# Patient Record
Sex: Female | Born: 1985 | Race: Black or African American | Hispanic: No | Marital: Married | Smoking: Never smoker
Health system: Southern US, Community
[De-identification: ages and names within clinical notes are randomized; demographics above are authoritative.]

## PROBLEM LIST (undated history)

## (undated) DIAGNOSIS — E559 Vitamin D deficiency, unspecified: Secondary | ICD-10-CM

## (undated) DIAGNOSIS — E282 Polycystic ovarian syndrome: Secondary | ICD-10-CM

## (undated) DIAGNOSIS — K219 Gastro-esophageal reflux disease without esophagitis: Secondary | ICD-10-CM

## (undated) DIAGNOSIS — R000 Tachycardia, unspecified: Secondary | ICD-10-CM

## (undated) DIAGNOSIS — O24419 Gestational diabetes mellitus in pregnancy, unspecified control: Secondary | ICD-10-CM

## (undated) DIAGNOSIS — Z789 Other specified health status: Secondary | ICD-10-CM

## (undated) DIAGNOSIS — F32A Depression, unspecified: Secondary | ICD-10-CM

## (undated) DIAGNOSIS — F329 Major depressive disorder, single episode, unspecified: Secondary | ICD-10-CM

## (undated) HISTORY — DX: Depression, unspecified: F32.A

## (undated) HISTORY — DX: Gastro-esophageal reflux disease without esophagitis: K21.9

## (undated) HISTORY — DX: Polycystic ovarian syndrome: E28.2

## (undated) HISTORY — DX: Vitamin D deficiency, unspecified: E55.9

## (undated) HISTORY — DX: Gestational diabetes mellitus in pregnancy, unspecified control: O24.419

## (undated) HISTORY — DX: Tachycardia, unspecified: R00.0

## (undated) HISTORY — PX: WISDOM TOOTH EXTRACTION: SHX21

## (undated) HISTORY — PX: NO PAST SURGERIES: SHX2092

---
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
## 2013-02-05 ENCOUNTER — Encounter (HOSPITAL_COMMUNITY): Payer: Self-pay | Admitting: Emergency Medicine

## 2013-02-05 ENCOUNTER — Emergency Department (HOSPITAL_COMMUNITY)
Admission: EM | Admit: 2013-02-05 | Discharge: 2013-02-05 | Disposition: A | Discharge status: Home / Self Care | Payer: Self-pay | Attending: Emergency Medicine | Admitting: Emergency Medicine

## 2013-02-05 ENCOUNTER — Emergency Department (HOSPITAL_COMMUNITY): Payer: Self-pay

## 2013-02-05 DIAGNOSIS — Z3202 Encounter for pregnancy test, result negative: Secondary | ICD-10-CM | POA: Insufficient documentation

## 2013-02-05 DIAGNOSIS — R1031 Right lower quadrant pain: Secondary | ICD-10-CM | POA: Insufficient documentation

## 2013-02-05 DIAGNOSIS — N83209 Unspecified ovarian cyst, unspecified side: Secondary | ICD-10-CM

## 2013-02-05 DIAGNOSIS — R109 Unspecified abdominal pain: Secondary | ICD-10-CM

## 2013-02-05 DIAGNOSIS — R000 Tachycardia, unspecified: Secondary | ICD-10-CM | POA: Insufficient documentation

## 2013-02-05 LAB — URINALYSIS, ROUTINE W REFLEX MICROSCOPIC
Bilirubin Urine: NEGATIVE
Leukocytes, UA: NEGATIVE
Nitrite: NEGATIVE
Specific Gravity, Urine: 1.025 (ref 1.005–1.030)
Urobilinogen, UA: 0.2 mg/dL (ref 0.0–1.0)
pH: 5.5 (ref 5.0–8.0)

## 2013-02-05 LAB — CBC
HCT: 43.7 % (ref 36.0–46.0)
Hemoglobin: 14.9 g/dL (ref 12.0–15.0)
MCH: 28.9 pg (ref 26.0–34.0)
MCHC: 34.1 g/dL (ref 30.0–36.0)
RDW: 12.8 % (ref 11.5–15.5)

## 2013-02-05 LAB — COMPREHENSIVE METABOLIC PANEL
Albumin: 4 g/dL (ref 3.5–5.2)
BUN: 12 mg/dL (ref 6–23)
Calcium: 9.6 mg/dL (ref 8.4–10.5)
Creatinine, Ser: 0.78 mg/dL (ref 0.50–1.10)
GFR calc Af Amer: >90 mL/min (ref >90)
Glucose, Bld: 84 mg/dL (ref 70–99)
Potassium: 3.8 mEq/L (ref 3.5–5.1)
Total Protein: 8.1 g/dL (ref 6.0–8.3)

## 2013-02-05 LAB — PREGNANCY, URINE: Preg Test, Ur: NEGATIVE

## 2013-02-05 MED ORDER — DEXTROSE 5 % IV SOLN
250.0000 mg | Freq: Once | INTRAVENOUS | Status: AC
  Administered 2013-02-05: 250 mg via INTRAVENOUS
  Filled 2013-02-05: qty 250

## 2013-02-05 MED ORDER — HYDROCODONE-ACETAMINOPHEN 5-325 MG PO TABS: 2.0000 | ORAL_TABLET | ORAL | Status: DC | PRN

## 2013-02-05 MED ORDER — IOHEXOL 300 MG/ML  SOLN
50.0000 mL | INTRAMUSCULAR | Status: AC
  Administered 2013-02-05: 50 mL via ORAL

## 2013-02-05 MED ORDER — ONDANSETRON HCL 4 MG/2ML IJ SOLN
4.0000 mg | Freq: Once | INTRAMUSCULAR | Status: AC
  Administered 2013-02-05: 4 mg via INTRAVENOUS
  Filled 2013-02-05: qty 2

## 2013-02-05 MED ORDER — MORPHINE SULFATE 4 MG/ML IJ SOLN
4.0000 mg | Freq: Once | INTRAMUSCULAR | Status: AC
  Administered 2013-02-05: 4 mg via INTRAVENOUS
  Filled 2013-02-05: qty 1

## 2013-02-05 MED ORDER — IOHEXOL 300 MG/ML  SOLN
100.0000 mL | Freq: Once | INTRAMUSCULAR | Status: AC | PRN
  Administered 2013-02-05: 100 mL via INTRAVENOUS

## 2013-02-05 MED ORDER — AZITHROMYCIN 1 G PO PACK
1.0000 g | PACK | Freq: Once | ORAL | Status: AC
  Administered 2013-02-05: 1 g via ORAL
  Filled 2013-02-05: qty 1

## 2013-02-05 MED ORDER — SODIUM CHLORIDE 0.9 % IV BOLUS (SEPSIS)
1000.0000 mL | Freq: Once | INTRAVENOUS | Status: AC
  Administered 2013-02-05: 1000 mL via INTRAVENOUS

## 2013-02-05 NOTE — ED Notes (Signed)
Patient currently sitting up in bed; no respiratory or acute distress noted.  Patient updated on plan of care; informed patient that she is currently pending for CT scan.  Patient denies any needs at this time; will continue to monitor.

## 2013-02-05 NOTE — ED Notes (Signed)
Patient back from CT scan; currently sitting up in bed; no respiratory or acute distress noted.  Patient updated on plan of care; informed patient that we are currently waiting for results of CT scan.  Patient denies any other needs at this time; will continue to monitor.

## 2013-02-05 NOTE — ED Notes (Signed)
Patient currently resting quietly in bed; no respiratory or acute distress noted.  Patient updated on plan of care; informed patient that we are currently going to administer Rocephin through her IV.  After administration, patient will be discharged home.  Patient denies any other symptoms at this time.  Will continue to monitor.

## 2013-02-05 NOTE — ED Notes (Signed)
reprots sudden onset of lower abdominal pain that has worsened over the course of the day. LMP 1/27, denies vaginal discharge and odor. Pain is intermittent. Denies nausea, vomiting, diarrhhea.

## 2013-02-05 NOTE — ED Notes (Signed)
Received bedside report from Sophia Oneill, [place].  Patient currently resting quietly in bed; no respiratory or acute distress noted.  Patient given contrast/water cups to drink.  Informed patient to notify staff when she has finished the first cup.  Patient denies any needs at this time; will continue to monitor.

## 2013-02-05 NOTE — ED Provider Notes (Signed)
History     CSN: [id number]  Arrival date & time 02/05/13  0004   First MD Initiated Contact with Patient 02/05/13 0012      Chief Complaint  Patient presents with  . Abdominal Pain    (Consider location/radiation/quality/duration/timing/severity/associated sxs/prior treatment) HPI  History reviewed. No pertinent past medical history.  History reviewed. No pertinent past surgical history.  History reviewed. No pertinent family history.  History  Substance Use Topics  . Smoking status: Never Smoker   . Smokeless tobacco: Not on file  . Alcohol Use: No    OB History   Grav Para Term Preterm Abortions TAB SAB Ect Mult Living                  Review of Systems  Allergies  Review of patient's allergies indicates no known allergies.  Home Medications   Current Outpatient Rx  Name  Route  Sig  Dispense  Refill  . Multiple Vitamin (MULTIVITAMIN WITH MINERALS) TABS   Oral   Take 1 tablet by mouth daily.         Marland Kitchen HYDROcodone-acetaminophen (NORCO/VICODIN) 5-325 MG per tablet   Oral   Take 2 tablets by mouth every 4 (four) hours as needed for pain.   10 tablet   0     BP 137/99  Pulse 108  Temp(Src) 98.6 F (37 C) (Oral)  Resp 20  SpO2 100%  LMP 01/20/2013  Physical Exam  ED Course  Procedures (including critical care time)  Labs Reviewed  CBC - Abnormal; Notable for the following:    WBC 15.5 (*)    RBC 5.16 (*)    All other components within normal limits  URINALYSIS, ROUTINE W REFLEX MICROSCOPIC - Abnormal; Notable for the following:    APPearance CLOUDY (*)    All other components within normal limits  COMPREHENSIVE METABOLIC PANEL  PREGNANCY, URINE   Ct Abdomen Pelvis W Contrast  02/05/2013  *RADIOLOGY REPORT*  Clinical Data: Sudden onset of lower abdominal pain.  CT ABDOMEN AND PELVIS WITH CONTRAST  Technique:  Multidetector CT imaging of the abdomen and pelvis was performed following the standard protocol during bolus administration of  intravenous contrast.  Contrast: OMNIPAQUE IOHEXOL 300 MG/ML  SOLN  Comparison: None.  Findings: The visualized lung bases are clear.  The liver and spleen are unremarkable in appearance.  The gallbladder is within normal limits.  The pancreas and adrenal glands are unremarkable.  A 3 mm stone is noted at the interpole region of the right kidney. The kidneys are otherwise unremarkable in appearance.  There is no evidence of hydronephrosis.  No obstructing ureteral stones are seen.  No perinephric stranding is appreciated.  No free fluid is identified.  The small bowel is unremarkable in appearance.  The stomach is within normal limits.  No acute vascular abnormalities are seen.  The appendix is normal in caliber, without evidence for appendicitis.  The colon is unremarkable in appearance.  The bladder is decompressed and not well assessed.  The uterus is grossly unremarkable in appearance.  The ovaries are grossly symmetric; no suspicious adnexal masses are seen.  A 2.7 cm right adnexal cystic lesion is likely physiologic in nature.  No inguinal lymphadenopathy is seen.  No acute osseous abnormalities are identified.  IMPRESSION:  1.  No acute abnormality seen within the abdomen or pelvis. 2.  3 mm nonobstructing stone noted at the interpole region of the right kidney.   Original Report Authenticated By: Tonia Ghent,  M.D.      1. Abdominal pain   2. Ovarian cyst       MDM  Improved.  Ct + adnexal cyst.  Will abx to prophylax.  Will dc with analgesia to fu oupt.  Ret new/worsening sxs        Nickey Kloepfer Lytle Michaels, MD 02/05/13 [phone]

## 2013-02-05 NOTE — ED Provider Notes (Signed)
History     CSN: [id number]  Arrival date & time 02/05/13  0004   First MD Initiated Contact with Patient 02/05/13 0012      Chief Complaint  Patient presents with  . Abdominal Pain    (Consider location/radiation/quality/duration/timing/severity/associated sxs/prior treatment) Patient is a 27 y.o. female presenting with abdominal pain. The history is provided by the patient.  Abdominal Pain Pain location:  RLQ Pain quality: aching and cramping   Pain radiates to:  Does not radiate Pain severity:  Mild Onset quality:  Sudden Duration:  6 hours Timing:  Intermittent Progression:  Waxing and waning Chronicity:  New Relieved by:  Nothing Worsened by:  Nothing tried Ineffective treatments:  None tried   No past medical history on file.  No past surgical history on file.  No family history on file.  History  Substance Use Topics  . Smoking status: Not on file  . Smokeless tobacco: Not on file  . Alcohol Use: Not on file    OB History   No data available      Review of Systems  Gastrointestinal: Positive for abdominal pain.  All other systems reviewed and are negative.    Allergies  Review of patient's allergies indicates no known allergies.  Home Medications  No current outpatient prescriptions on file.  BP 176/98  Pulse 125  Temp(Src) 98.6 F (37 C) (Oral)  Resp 18  SpO2 100%  LMP 01/20/2013  Physical Exam  Constitutional: She is oriented to person, place, and time. She appears well-developed and well-nourished.  HENT:  Head: Normocephalic and atraumatic.  Eyes: Conjunctivae and EOM are normal. Pupils are equal, round, and reactive to light.  Neck: Normal range of motion.  Cardiovascular: Regular rhythm and normal heart sounds.  Tachycardia present.   Pulmonary/Chest: Effort normal and breath sounds normal.  Abdominal: Soft. Bowel sounds are normal. There is tenderness in the suprapubic area.    Musculoskeletal: Normal range of motion.    Neurological: She is alert and oriented to person, place, and time.  Skin: Skin is warm and dry.  Psychiatric: She has a normal mood and affect. Her behavior is normal.    ED Course  Procedures (including critical care time)  Labs Reviewed  CBC  COMPREHENSIVE METABOLIC PANEL  URINALYSIS, ROUTINE W REFLEX MICROSCOPIC  PREGNANCY, URINE  URINALYSIS, ROUTINE W REFLEX MICROSCOPIC   No results found.   No diagnosis found.    MDM  + intermitant rlq pain.  Tachycardia.  Will ivf, analgesia,  Lab, reassess        Rosanne Ashing, MD 02/05/13 0028

## 2013-02-22 DIAGNOSIS — E282 Polycystic ovarian syndrome: Secondary | ICD-10-CM

## 2013-02-22 HISTORY — DX: Polycystic ovarian syndrome: E28.2

## 2013-05-26 ENCOUNTER — Other Ambulatory Visit (HOSPITAL_COMMUNITY): Payer: Self-pay | Admitting: Obstetrics & Gynecology

## 2013-05-26 DIAGNOSIS — N970 Female infertility associated with anovulation: Secondary | ICD-10-CM

## 2013-05-26 DIAGNOSIS — N943 Premenstrual tension syndrome: Secondary | ICD-10-CM

## 2013-06-02 ENCOUNTER — Ambulatory Visit (HOSPITAL_COMMUNITY): Payer: Self-pay

## 2013-11-04 ENCOUNTER — Encounter: Payer: Self-pay | Admitting: Internal Medicine

## 2013-11-04 ENCOUNTER — Ambulatory Visit (INDEPENDENT_AMBULATORY_CARE_PROVIDER_SITE_OTHER): Payer: BC Managed Care – PPO | Admitting: Internal Medicine

## 2013-11-04 VITALS — BP 120/84 | HR 105 | Temp 98.5°F | Resp 12 | Ht 63.0 in | Wt 200.7 lb

## 2013-11-04 DIAGNOSIS — E282 Polycystic ovarian syndrome: Secondary | ICD-10-CM

## 2013-11-04 NOTE — Progress Notes (Signed)
Patient ID: Sophia Oneill, female   DOB: 1986/12/04, 27 y.o.   MRN: [id number]  HPI: Sophia Oneill is a 27 y.o. female, referred by Dr Mitchel Honour for evaluation for PCOS.  She was dx with PCOS in 02/2013 >> weight gain and irregular menstrual cycles.She had TV U/S and was told she had ovarian cysts. She was started on OCP Sprintec >> stopped as she did not notice weight loss. She did not want to take them in the first place as she was trying to conceive >> stopped in 05/2013.  Weight gain: - 10 lbs in 2 months - no steroid use - no weight loss meds - Meals: - Breakfast: yoghurt, fruit - Lunch: fish/baked chicken/veggies - Dinner: salad/chicken/turkey burger - Snacks: 3: granola bars/fruit - Diets tried: low-carb diet - she meets with a nutritionist every month - Exercise: cardio, 1-3x a week  Fertility/Menstrual cycles: - + h/o ovarian cyst on right ovary - went to ED for AP in 01/2013 - children: 0 - miscarriages: 1 in 2008, at that time she had regular menstrual cycles - contraception:no, tried to conceive for >1 year - ovulation strips positive - LMP 10/21/2013  Acne: - has acne on face >> saw dermatologist >> started adapalene gel and doxycyclene  Hirsutism: - chin, not much  Treatments tried: - did not try Metformin - did not try Spironolactone - did not try [place]  Other meds: - SSRIs: no  Other medical pbs: - GERD  - Last thyroid tests, from 2 years ago: normal, per her report, no records available  - she checks sugars every am: 60-70s  Pt has FH of obesity in sister. FH of DM in father. Sister has PCOS.  ROS: Constitutional: + weight gain, + increased appetiteno fatigue, no subjective hyperthermia/hypothermia, + increased urination Eyes: no blurry vision, no xerophthalmia ENT: no sore throat, no nodules palpated in throat, no dysphagia/odynophagia, no hoarseness Cardiovascular: no CP/SOB/palpitations/leg swelling Respiratory: no  cough/SOB Gastrointestinal: + N/no V/D/C/+ heartburn Musculoskeletal: no muscle/joint aches Skin: + acne spots on face - healed, hair on chin Neurological: no tremors/numbness/tingling/dizziness, + HA Psychiatric: no depression/anxiety  Past Medical History  Diagnosis Date  . PCOS (polycystic ovarian syndrome) 02/2013   History reviewed. No pertinent past surgical history. History   Social History  . Marital Status: Single    Spouse Name: N/A    Number of Children: 0        Occupational History  . Client service associate   Social History Main Topics  . Smoking status: Never Smoker   . Smokeless tobacco: Not on file  . Alcohol Use: No  . Drug Use: No  . Sexual Activity: Yes    Partners: Male   Social History Narrative   Regular exercise: yes, cycling, treadmill, weights   Caffeine use: none   Current Outpatient Prescriptions on File Prior to Visit  Medication Sig Dispense Refill  . Multiple Vitamin (MULTIVITAMIN WITH MINERALS) TABS Take 1 tablet by mouth daily.      Marland Kitchen HYDROcodone-acetaminophen (NORCO/VICODIN) 5-325 MG per tablet Take 2 tablets by mouth every 4 (four) hours as needed for pain.  10 tablet  0   No current facility-administered medications on file prior to visit.   No Known Allergies Family History  Problem Relation Age of Onset  . Hypertension Mother   . Diabetes Father     DM Type II  . Hypertension Father    PE: BP 120/84  Pulse 105  Temp(Src) 98.5 F (36.9 C) (Oral)  Resp 12  Ht 5\' 3"  (1.6 m)  Wt 200 lb 11.2 oz (91.037 kg)  BMI 35.56 kg/m2  SpO2 96% Wt Readings from Last 3 Encounters:  11/04/13 200 lb 11.2 oz (91.037 kg)   Constitutional: overweight, in NAD, no full supraclavicular fat pads Eyes: PERRLA, EOMI, no exophthalmos ENT: moist mucous membranes, no thyromegaly, no cervical lymphadenopathy Cardiovascular: RRR, No MRG Respiratory: CTA B Gastrointestinal: abdomen soft, NT, ND, BS+ Musculoskeletal: no deformities, strength  intact in all 4 Skin: moist, warm; no hair visible on chin (plucked), + acne spots - healed, on chin, no vellum on sideburns, no skin tags, + acanthosis nigricans, no purple, wide, stretch marks Neurological: no tremor with outstretched hands, DTR normal in all 4  ASSESSMENT: 1. ?PCOS  PLAN: 1.  I had a long discussion with the patient about the fact that the PCOS is a misnomer, a patient does not actually have to have polycystic ovaries to be diagnosed with the disorder. This is of sum of several conditions, including insulin resistance (and therefore a higher risk of developing diabetes later in life), weight gain, acne, hirsutism, irregular menstrual cycles, and decreased fertility. - We also discussed about the fact that the treatment is usually targeted to addressing the problem that concerns the patient the most: acne/hirsutism, weight gain, or fertility, but there is no single treatment for PCOS. The first-line therapy are oral contraceptives which she is avoiding because of desired pregnancy. Since she is concerned with her weight, we can use metformin; since she is concerned about fertility, we can try metformin and if this does not work, I will refer her to reproductive endocrinology for possible use of clomiphene. - I would first want to make sure that she has PCOS, and that her thyroid and prolactin tests are normal. I will add a hemoglobin A1c I will also check a pregnancy test. To rule out [place]-CAH, I will check a 17 hydroxyprogesterone. She will return in the third day of her menstrual cycle to have these drawn (she is now at ovulation, which can influence the tests). Orders Placed This Encounter  Procedures  . TSH  . T4, free  . T3, free  . Prolactin  . Testosterone, free, total  . LH  . FSH  . 17-Hydroxyprogesterone  . HgB A1c  . Androstenedione  . hCG, quantitative, pregnancy  . Estradiol  - I will see her back in 4 months, but will start metformin as soon as I have the labs  back  TSH      Result Value Range   TSH 0.98  0.35 - 5.50 uIU/mL  T4, FREE      Result Value Range   Free T4 0.77  0.60 - 1.60 ng/dL  T3, FREE      Result Value Range   T3, Free 3.7  2.3 - 4.2 pg/mL  LUTEINIZING HORMONE      Result Value Range   LH 17.64    FOLLICLE STIMULATING HORMONE      Result Value Range   FSH 6.9    HEMOGLOBIN A1C      Result Value Range   Hemoglobin A1C 5.8  4.6 - 6.5 %  HCG, QUANTITATIVE, PREGNANCY      Result Value Range   hCG, Beta Chain, Quant, S 0.43    TESTOSTERONE, FREE, TOTAL      Result Value Range   Testosterone 71 (*) 10 - 70 ng/dL   Sex Hormone Binding 18  18 - 114 nmol/L  Testosterone, Free 17.6 (*) 0.6 - 6.8 pg/mL   Testosterone-% Free 2.5 (*) 0.4 - 2.4 %   Narrative:    Performed at:  [facility]                [address]                [place], [place] [address]  PROLACTIN      Result Value Range   Prolactin 7.0     Narrative:    Performed at:  [facility]                [address]                [place], [place] [address]  ANDROSTENEDIONE      Result Value Range   Androstenedione 263     Narrative:    Performed at:  [facility]                [phone] [facility]                [place], [place] [address]  17-HYDROXYPROGESTERONE      Result Value Range   17-OH-Progesterone, LC/MS/MS 83     Narrative:    Performed at:  [facility]                [phone] [facility] [place], [place] [address]  ESTRADIOL, FREE      Result Value Range   Estradiol, Free 0.95     Estradiol 46     Results Received 11/27/13     Narrative:    Performed at:  [facility]                [address]                [place], [place] [address]   3:1 ratio LH: FSH, common in PCOS. TFTs normal. HbA1c a little high, in the preDM range, but test not very specific at the low levels. Testosterone high, as is Androstenedione - all of the above  indicative of PCOS.   Received records from OB/GYN (Dr. Mitchel Honour) on 11/27/2013: - 03/13/2013: Patient had transvaginal ultrasound:  Uterus with volume 60 cc, length 6.7x AP 3.7 x width 4.6 cm Endometrial thickness 5.6 mm Multiple intramural fibroids, largest measuring 1.4 cm. One of them does abut the endometrial  stripe in the left fundal area. Ovaries have multiple follicular cysts, largest on each side measuring 7 mm. These  suggest polycystic ovaries. No fluid in the cul-de-sac. - at that time, it was also mentioned that she was evaluated on 02/05/2013 in the emergency room for right lower quadrant pain, and was found to have a 2.7 cm right ovarian simple cyst. She was reportedly treated with antibiotics and the pain resolved, but has gradually returned. - At that time, the question that patient was on Sprintec - Pap smear from 03/05/2013 normal  From all the above investigations, pt with clear picture of PCOS. Will start Metformin - advance to 1000 mg bid.

## 2013-11-04 NOTE — Patient Instructions (Addendum)
Please return in the 3rd day of your menstrual cycle for labs. Please come back for a follow-up appointment in 4 months.

## 2013-11-05 ENCOUNTER — Other Ambulatory Visit: Payer: BC Managed Care – PPO

## 2013-11-10 ENCOUNTER — Telehealth: Payer: Self-pay | Admitting: Internal Medicine

## 2013-11-10 NOTE — Telephone Encounter (Signed)
Received 11 pages from [facility], sent to Dr. Elvera Lennox @ [facility]. 11/10/13/ss

## 2013-11-18 ENCOUNTER — Other Ambulatory Visit (INDEPENDENT_AMBULATORY_CARE_PROVIDER_SITE_OTHER): Payer: BC Managed Care – PPO

## 2013-11-18 DIAGNOSIS — E282 Polycystic ovarian syndrome: Secondary | ICD-10-CM

## 2013-11-19 LAB — T4, FREE: Free T4: 0.77 ng/dL (ref 0.60–1.60)

## 2013-11-19 LAB — LUTEINIZING HORMONE: LH: 17.64 m[IU]/mL

## 2013-11-19 LAB — FOLLICLE STIMULATING HORMONE: FSH: 6.9 m[IU]/mL

## 2013-11-19 LAB — T3, FREE: T3, Free: 3.7 pg/mL (ref 2.3–4.2)

## 2013-11-21 LAB — TESTOSTERONE, FREE, TOTAL, SHBG: Sex Hormone Binding: 18 nmol/L (ref 18–114)

## 2013-11-24 LAB — ANDROSTENEDIONE: Androstenedione: 263 ng/dL

## 2013-11-24 LAB — 17-HYDROXYPROGESTERONE: 17-OH-Progesterone, LC/MS/MS: 83 ng/dL

## 2013-11-27 LAB — ESTRADIOL, FREE

## 2013-11-28 ENCOUNTER — Encounter: Payer: Self-pay | Admitting: Internal Medicine

## 2013-11-28 MED ORDER — METFORMIN HCL 1000 MG PO TABS: 1000.0000 mg | ORAL_TABLET | Freq: Two times a day (BID) | ORAL | Status: DC

## 2013-12-01 ENCOUNTER — Telehealth: Payer: Self-pay

## 2013-12-01 NOTE — Telephone Encounter (Signed)
Patient called requesting form for work.   Thanks!

## 2013-12-05 ENCOUNTER — Telehealth: Payer: Self-pay | Admitting: *Deleted

## 2013-12-05 NOTE — Telephone Encounter (Signed)
Hi Dr. Elvera Lennox! I sent over forms to your office from my employer(FMLA forms) because of the side effects of the Metformin which is causing issues with my job performance.I just wanted to confirm if they were received. I did fax them to [phone]. Thank you! Sophia Oneill

## 2013-12-25 NOTE — L&D Delivery Note (Signed)
Delivery Note At 11:34 PM a healthy female was delivered via Vaginal, Spontaneous Delivery (Presentation: Right Occiput Anterior).  APGAR: 8, 9; weight pending.   Placenta status: Intact, Spontaneous.  Cord: 3 vessels with the following complications: None.  Cord pH: not done  Anesthesia: Epidural  Episiotomy: None Lacerations:  Anterior supra-urethral tear Suture Repair: 3.0 vicryl rapide Est. Blood Loss (mL): 300  Mom to postpartum.  Baby to Couplet care / Skin to Skin. Consent for circumcision obtained.  Karlynn Furrow,MARIE-LYNE 10/19/2014, 11:50 PM

## 2014-03-05 ENCOUNTER — Encounter: Payer: Self-pay | Admitting: Internal Medicine

## 2014-03-05 ENCOUNTER — Ambulatory Visit (INDEPENDENT_AMBULATORY_CARE_PROVIDER_SITE_OTHER): Payer: BC Managed Care – PPO | Admitting: Internal Medicine

## 2014-03-05 VITALS — BP 122/70 | HR 86 | Temp 98.3°F | Resp 12 | Wt 196.0 lb

## 2014-03-05 DIAGNOSIS — E282 Polycystic ovarian syndrome: Secondary | ICD-10-CM

## 2014-03-05 NOTE — Progress Notes (Signed)
Patient ID: Sophia Oneill, female   DOB: 11/20/1986, 28 y.o.   MRN: [id number]  HPI: Sophia Oneill is a 28 y.o. female, initially referred by Dr Mitchel HonourMegan Morris for evaluation for PCOS. Last visit 4 mo ago. She is [redacted] weeks pregnant. She goes to [facility] (Dr Seymour BarsLavoie).  Reviewed history: She was dx with PCOS in 02/2013 >> weight gain and irregular menstrual cycles.She had TV U/S and was told she had ovarian cysts. She was started on OCP Sprintec >> stopped as she did not notice weight loss. She did not want to take them in the first place as she was trying to conceive >> stopped in 05/2013. Weight gain: - 10 lbs in 2 months - no steroid use - no weight loss meds - Meals: - Breakfast: yoghurt, fruit - Lunch: fish/baked chicken/veggies - Dinner: salad/chicken/turkey burger - Snacks: 3: granola bars/fruit - Diets tried: low-carb diet - she meets with a nutritionist every month - Exercise: cardio, 1-3x a week  Fertility/Menstrual cycles: - + h/o ovarian cyst on right ovary - went to ED for AP in 01/2013 - children: 0 - miscarriages: 1 in 2008, at that time she had regular menstrual cycles - contraception: no, tried to conceive for >1 year - ovulation strips positive - LMP 10/21/2013  Acne: - has acne on face >> saw dermatologist >> started adapalene gel and doxycyclene  Hirsutism: - chin, not much  Treatments tried: - on Metformin - did not try Spironolactone - did not try [place]  We started Metformin at last visit. We started 1000 mg bid, but had to cut back on Metformin to 500 mg daily. She lost 10 lbs since started Metformin and now gained 5 lbs since her pregnancy.  No sxs  ROS: Constitutional: + weight gain and loss, + increased appetiteno fatigue, no subjective hyperthermia/hypothermia, + increased urination Eyes: no blurry vision, no xerophthalmia ENT: no sore throat, no nodules palpated in throat, no dysphagia/odynophagia, no hoarseness Cardiovascular: no  CP/SOB/palpitations/leg swelling Respiratory: no cough/SOB Gastrointestinal: + some N/no V/D/C/heartburn Musculoskeletal: no muscle/joint aches Skin: no acne Neurological: no tremors/numbness/tingling/dizziness, + HA   PE: BP 122/70  Pulse 86  Temp(Src) 98.3 F (36.8 C) (Oral)  Resp 12  Wt 196 lb (88.905 kg)  SpO2 97% Wt Readings from Last 3 Encounters:  03/05/14 196 lb (88.905 kg)  11/04/13 200 lb 11.2 oz (91.037 kg)   Constitutional: overweight, in NAD Eyes: PERRLA, EOMI, no exophthalmos ENT: moist mucous membranes, no thyromegaly, no cervical lymphadenopathy Cardiovascular: RRR, No MRG Respiratory: CTA B Gastrointestinal: abdomen soft, NT, ND, BS+ Musculoskeletal: no deformities, strength intact in all 4 Skin: moist, warm; no hair visible on chin (plucked) Neurological: no tremor with outstretched hands, DTR normal in all 4  ASSESSMENT: 1. PCOS  PLAN: 1. Pt with PCOS, now with recently discovered pregnancy, after starting Metformin.  - She can only tolerate 500 mg Metformin daily and I advised her to continue this dose, if she can, throughout the pregnancy. - we discussed about testing for DM during the pregnancy - she will go back to dr Seymour BarsLavoie at the end of the month. Previous HbA1c in 10/2013: 5.8%. - had fetal U/s today >> all looks well - no labs today - will send labs and OV note to dr Seymour BarsLavoie, too - I will see her back 1 mo after she gives birth.

## 2014-03-05 NOTE — Patient Instructions (Signed)
CONGRATULATIONS!  I would continue Metformin 500 mg daily throughout the pregnancy.  Please return in 10 months with your sugar log.

## 2014-04-13 ENCOUNTER — Encounter (HOSPITAL_COMMUNITY): Payer: Self-pay | Admitting: Emergency Medicine

## 2014-04-13 DIAGNOSIS — O9989 Other specified diseases and conditions complicating pregnancy, childbirth and the puerperium: Secondary | ICD-10-CM | POA: Insufficient documentation

## 2014-04-13 DIAGNOSIS — R51 Headache: Secondary | ICD-10-CM | POA: Insufficient documentation

## 2014-04-13 DIAGNOSIS — E282 Polycystic ovarian syndrome: Secondary | ICD-10-CM | POA: Insufficient documentation

## 2014-04-13 DIAGNOSIS — O210 Mild hyperemesis gravidarum: Secondary | ICD-10-CM | POA: Insufficient documentation

## 2014-04-13 DIAGNOSIS — Z79899 Other long term (current) drug therapy: Secondary | ICD-10-CM | POA: Insufficient documentation

## 2014-04-13 LAB — CBC WITH DIFFERENTIAL/PLATELET
BASOS ABS: 0 10*3/uL (ref 0.0–0.1)
BASOS PCT: 0 % (ref 0–1)
Eosinophils Absolute: 0.2 10*3/uL (ref 0.0–0.7)
Eosinophils Relative: 2 % (ref 0–5)
HCT: 40.3 % (ref 36.0–46.0)
Hemoglobin: 13.8 g/dL (ref 12.0–15.0)
LYMPHS PCT: 27 % (ref 12–46)
Lymphs Abs: 3.6 10*3/uL (ref 0.7–4.0)
MCH: 28.6 pg (ref 26.0–34.0)
MCHC: 34.2 g/dL (ref 30.0–36.0)
MCV: 83.6 fL (ref 78.0–100.0)
Monocytes Absolute: 0.7 10*3/uL (ref 0.1–1.0)
Monocytes Relative: 6 % (ref 3–12)
NEUTROS ABS: 8.5 10*3/uL — AB (ref 1.7–7.7)
NEUTROS PCT: 65 % (ref 43–77)
PLATELETS: 276 10*3/uL (ref 150–400)
RBC: 4.82 MIL/uL (ref 3.87–5.11)
RDW: 12.6 % (ref 11.5–15.5)
WBC: 13.1 10*3/uL — AB (ref 4.0–10.5)

## 2014-04-13 LAB — COMPREHENSIVE METABOLIC PANEL
ALK PHOS: 60 U/L (ref 39–117)
ALT: 12 U/L (ref 0–35)
AST: 16 U/L (ref 0–37)
Albumin: 3.5 g/dL (ref 3.5–5.2)
BILIRUBIN TOTAL: 0.4 mg/dL (ref 0.3–1.2)
BUN: 8 mg/dL (ref 6–23)
CHLORIDE: 102 meq/L (ref 96–112)
CO2: 20 meq/L (ref 19–32)
Calcium: 9.5 mg/dL (ref 8.4–10.5)
Creatinine, Ser: 0.7 mg/dL (ref 0.50–1.10)
GFR calc Af Amer: >90 mL/min (ref >90)
GFR calc non Af Amer: >90 mL/min (ref >90)
Glucose, Bld: 88 mg/dL (ref 70–99)
POTASSIUM: 3.9 meq/L (ref 3.7–5.3)
SODIUM: 136 meq/L — AB (ref 137–147)
Total Protein: 7.5 g/dL (ref 6.0–8.3)

## 2014-04-13 MED ORDER — ONDANSETRON 4 MG PO TBDP
8.0000 mg | ORAL_TABLET | Freq: Once | ORAL | Status: AC
  Administered 2014-04-13: 8 mg via ORAL
  Filled 2014-04-13: qty 2

## 2014-04-13 NOTE — ED Notes (Signed)
Pt states nausea, and HA with dizziness at times today.  Pt states she is [redacted] weeks pregnant and has had a ultrasound to confirm placement.

## 2014-04-14 ENCOUNTER — Emergency Department (HOSPITAL_COMMUNITY)
Admission: EM | Admit: 2014-04-14 | Discharge: 2014-04-14 | Disposition: A | Discharge status: Home / Self Care | Payer: BC Managed Care – PPO | Attending: Emergency Medicine | Admitting: Emergency Medicine

## 2014-04-14 DIAGNOSIS — G44209 Tension-type headache, unspecified, not intractable: Secondary | ICD-10-CM

## 2014-04-14 MED ORDER — ONDANSETRON 4 MG PO TBDP: 4.0000 mg | ORAL_TABLET | Freq: Three times a day (TID) | ORAL | Status: DC | PRN

## 2014-04-14 MED ORDER — ONDANSETRON 4 MG PO TBDP
4.0000 mg | ORAL_TABLET | Freq: Once | ORAL | Status: AC
  Administered 2014-04-14: 4 mg via ORAL
  Filled 2014-04-14: qty 1

## 2014-04-14 MED ORDER — HYDROCODONE-ACETAMINOPHEN 5-325 MG PO TABS
1.0000 | ORAL_TABLET | Freq: Once | ORAL | Status: AC
  Administered 2014-04-14: 1 via ORAL
  Filled 2014-04-14: qty 1

## 2014-04-14 MED ORDER — HYDROCODONE-ACETAMINOPHEN 5-325 MG PO TABS: 2.0000 | ORAL_TABLET | ORAL | Status: DC | PRN

## 2014-04-14 NOTE — ED Notes (Signed)
Pt A&OX4, ambulatory with slow, steady gait, verbalizing no complaints at this time.  

## 2014-04-14 NOTE — ED Provider Notes (Signed)
CSN: [id number]     Arrival date & time 04/13/14  2243 History   First MD Initiated Contact with Patient 04/14/14 [phone]     Chief Complaint  Patient presents with  . Morning Sickness  . Headache      HPI  Is resistant headache. Primarily left-sided bitemporal. States she is tender occiput. She slept poorly last 2 days. She chooses to coughing and turning because of her pregnancy. Currently 13 weeks uncomplicated pregnancy. Has not had nausea or morning sickness but when she awakened yesterday with headache she became somewhat nauseated. No scotoma no vision changes no numbness weakness tingling or other symptoms.  Past Medical History  Diagnosis Date  . PCOS (polycystic ovarian syndrome) 02/2013   History reviewed. No pertinent past surgical history. Family History  Problem Relation Age of Onset  . Hypertension Mother   . Diabetes Father     DM Type II  . Hypertension Father    History  Substance Use Topics  . Smoking status: Never Smoker   . Smokeless tobacco: Not on file  . Alcohol Use: No   OB History   Grav Para Term Preterm Abortions TAB SAB Ect Mult Living   1              Review of Systems  Constitutional: Negative for fever, chills, diaphoresis, appetite change and fatigue.  HENT: Negative for mouth sores, sore throat and trouble swallowing.   Eyes: Negative for visual disturbance.  Respiratory: Negative for cough, chest tightness, shortness of breath and wheezing.   Cardiovascular: Negative for chest pain.  Gastrointestinal: Negative for nausea, vomiting, abdominal pain, diarrhea and abdominal distention.  Endocrine: Negative for polydipsia, polyphagia and polyuria.  Genitourinary: Negative for dysuria, frequency and hematuria.  Musculoskeletal: Negative for gait problem.  Skin: Negative for color change, pallor and rash.  Neurological: Positive for headaches. Negative for dizziness, syncope and light-headedness.  Hematological: Does not bruise/bleed easily.    Psychiatric/Behavioral: Negative for behavioral problems and confusion.      Allergies  Review of patient's allergies indicates no known allergies.  Home Medications   Prior to Admission medications   Medication Sig Start Date End Date Taking? Authorizing Provider  metFORMIN (GLUCOPHAGE) 500 MG tablet Take 500 mg by mouth daily with breakfast.   Yes Historical Provider, MD  Prenatal Vit-Fe Fumarate-FA (PRENATAL MULTIVITAMIN) TABS tablet Take 1 tablet by mouth daily at 12 noon.   Yes Historical Provider, MD  HYDROcodone-acetaminophen (NORCO/VICODIN) 5-325 MG per tablet Take 2 tablets by mouth every 4 (four) hours as needed. 04/14/14   Rolland PorterMark Colsen Modi, MD  ondansetron (ZOFRAN ODT) 4 MG disintegrating tablet Take 1 tablet (4 mg total) by mouth every 8 (eight) hours as needed for nausea. 04/14/14   Rolland PorterMark Lorenzo Arscott, MD   BP 127/76  Pulse 89  Temp(Src) 98.4 F (36.9 C) (Oral)  Resp 16  Ht 5\' 3"  (1.6 m)  Wt 189 lb (85.73 kg)  BMI 33.49 kg/m2  SpO2 100% Physical Exam  Constitutional: She is oriented to person, place, and time. She appears well-developed and well-nourished. No distress.  HENT:  Head: Normocephalic.    Eyes: Conjunctivae are normal. Pupils are equal, round, and reactive to light. No scleral icterus.  Neck: Normal range of motion. Neck supple. No thyromegaly present.  Cardiovascular: Normal rate and regular rhythm.  Exam reveals no gallop and no friction rub.   No murmur heard. Pulmonary/Chest: Effort normal and breath sounds normal. No respiratory distress. She has no wheezes. She has no  rales.  Abdominal: Soft. Bowel sounds are normal. She exhibits no distension. There is no tenderness. There is no rebound.  Musculoskeletal: Normal range of motion.  Neurological: She is alert and oriented to person, place, and time.  Cranial nerves. Supple neck. Normal gait and peripheral neurological exam.  Skin: Skin is warm and dry. No rash noted.  Psychiatric: She has a normal mood and  affect. Her behavior is normal.    ED Course  Procedures (including critical care time) Labs Review Labs Reviewed  CBC WITH DIFFERENTIAL - Abnormal; Notable for the following:    WBC 13.1 (*)    Neutro Abs 8.5 (*)    All other components within normal limits  COMPREHENSIVE METABOLIC PANEL - Abnormal; Notable for the following:    Sodium 136 (*)    All other components within normal limits  URINALYSIS, ROUTINE W REFLEX MICROSCOPIC    Imaging Review No results found.   EKG Interpretation None      MDM   Final diagnoses:  Muscle tension headache    Benign exam. Tender at the occiput. Reproduces symptoms to his temple consistent with a muscle tension headache and trigger points. Not migrainous. No photophobia. No neurological symptoms.    Rolland PorterMark Zahava Quant, MD 04/14/14 [phone]

## 2014-04-14 NOTE — Discharge Instructions (Signed)
Tension Headache  A tension headache is pain, pressure, or aching felt over the front and sides of the head. Tension headaches often come after stress, feeling worried (anxiety), or feeling sad or down for a while (depressed).  HOME CARE  · Only take medicine as told by your doctor.  · Lie down in a dark, quiet room when you have a headache.  · Keep a journal to find out if certain things bring on headaches. For example, write down:  · What you eat and drink.  · How much sleep you get.  · Any change to your diet or medicines.  · Relax by getting a massage or doing other relaxing activities.  · Put ice or heat packs on the head and neck area as told by your doctor.  · Lessen stress.  · Sit up straight. Do not tighten (tense) your muscles.  · Quit smoking if you smoke.  · Lessen how much alcohol you drink.  · Lessen how much caffeine you drink, or stop drinking caffeine.  · Eat and exercise regularly.  · Get enough sleep.  · Avoid using too much pain medicine.  GET HELP RIGHT AWAY IF:   · Your headache becomes really bad.  · You have a fever.  · You have a stiff neck.  · You have trouble seeing.  · Your muscles are weak, or you lose muscle control.  · You lose your balance or have trouble walking.  · You feel like you will pass out (faint), or you pass out.  · You have really bad symptoms that are different than your first symptoms.  · You have problems with the medicines given to you by your doctor.  · Your medicines do not work.  · Your headache feels different than the other headaches.  · You feel sick to your stomach (nauseous) or throw up (vomit).  MAKE SURE YOU:   · Understand these instructions.  · Will watch your condition.  · Will get help right away if you are not doing well or get worse.  Document Released: 03/07/2010 Document Revised: 03/04/2012 Document Reviewed: 12/01/2011  [employer]® Patient Information ©2014 [employer].

## 2014-05-13 LAB — OB RESULTS CONSOLE ABO/RH: RH Type: NEGATIVE

## 2014-05-13 LAB — OB RESULTS CONSOLE HEPATITIS B SURFACE ANTIGEN: HEP B S AG: NEGATIVE

## 2014-05-13 LAB — OB RESULTS CONSOLE RPR
RPR: NONREACTIVE
RPR: NONREACTIVE

## 2014-05-13 LAB — OB RESULTS CONSOLE ANTIBODY SCREEN: Antibody Screen: NEGATIVE

## 2014-05-13 LAB — OB RESULTS CONSOLE GC/CHLAMYDIA
Chlamydia: NEGATIVE
GC PROBE AMP, GENITAL: NEGATIVE

## 2014-05-13 LAB — OB RESULTS CONSOLE HIV ANTIBODY (ROUTINE TESTING): HIV: NONREACTIVE

## 2014-05-13 LAB — OB RESULTS CONSOLE RUBELLA ANTIBODY, IGM: RUBELLA: IMMUNE

## 2014-09-15 LAB — OB RESULTS CONSOLE GBS
GBS: NEGATIVE
GBS: POSITIVE

## 2014-10-14 ENCOUNTER — Encounter (HOSPITAL_COMMUNITY): Payer: Self-pay | Admitting: *Deleted

## 2014-10-14 ENCOUNTER — Telehealth (HOSPITAL_COMMUNITY): Payer: Self-pay | Admitting: *Deleted

## 2014-10-14 NOTE — Telephone Encounter (Signed)
Preadmission screen  

## 2014-10-15 ENCOUNTER — Other Ambulatory Visit: Payer: Self-pay | Admitting: Obstetrics & Gynecology

## 2014-10-18 ENCOUNTER — Inpatient Hospital Stay (HOSPITAL_COMMUNITY)
Admission: AD | Admit: 2014-10-18 | Payer: BC Managed Care – PPO | Source: Ambulatory Visit | Admitting: Obstetrics & Gynecology

## 2014-10-19 ENCOUNTER — Inpatient Hospital Stay (HOSPITAL_COMMUNITY)
Admission: RE | Admit: 2014-10-19 | Discharge: 2014-10-21 | DRG: 775 | Disposition: A | Discharge status: Home / Self Care | Payer: Managed Care, Other (non HMO) | Source: Ambulatory Visit | Attending: Obstetrics & Gynecology | Admitting: Obstetrics & Gynecology

## 2014-10-19 ENCOUNTER — Inpatient Hospital Stay (HOSPITAL_COMMUNITY): Payer: Managed Care, Other (non HMO) | Admitting: Anesthesiology

## 2014-10-19 ENCOUNTER — Encounter (HOSPITAL_COMMUNITY): Payer: Self-pay

## 2014-10-19 ENCOUNTER — Encounter (HOSPITAL_COMMUNITY): Payer: Managed Care, Other (non HMO) | Admitting: Anesthesiology

## 2014-10-19 DIAGNOSIS — Z8249 Family history of ischemic heart disease and other diseases of the circulatory system: Secondary | ICD-10-CM

## 2014-10-19 DIAGNOSIS — O360930 Maternal care for other rhesus isoimmunization, third trimester, not applicable or unspecified: Secondary | ICD-10-CM | POA: Diagnosis present

## 2014-10-19 DIAGNOSIS — Z3A39 39 weeks gestation of pregnancy: Secondary | ICD-10-CM | POA: Diagnosis present

## 2014-10-19 DIAGNOSIS — Z833 Family history of diabetes mellitus: Secondary | ICD-10-CM

## 2014-10-19 HISTORY — DX: Other specified health status: Z78.9

## 2014-10-19 LAB — CBC
HCT: 37.1 % (ref 36.0–46.0)
HEMOGLOBIN: 12.6 g/dL (ref 12.0–15.0)
MCH: 27.1 pg (ref 26.0–34.0)
MCHC: 34 g/dL (ref 30.0–36.0)
MCV: 79.8 fL (ref 78.0–100.0)
Platelets: 265 10*3/uL (ref 150–400)
RBC: 4.65 MIL/uL (ref 3.87–5.11)
RDW: 14.1 % (ref 11.5–15.5)
WBC: 11.6 10*3/uL — ABNORMAL HIGH (ref 4.0–10.5)

## 2014-10-19 LAB — TYPE AND SCREEN
ABO/RH(D): O NEG
Antibody Screen: NEGATIVE

## 2014-10-19 LAB — RPR

## 2014-10-19 LAB — ABO/RH: ABO/RH(D): O NEG

## 2014-10-19 MED ORDER — CITRIC ACID-SODIUM CITRATE 334-500 MG/5ML PO SOLN: 30.0000 mL | ORAL | Status: DC | PRN

## 2014-10-19 MED ORDER — LACTATED RINGERS IV SOLN: 500.0000 mL | Freq: Once | INTRAVENOUS | Status: DC

## 2014-10-19 MED ORDER — EPHEDRINE 5 MG/ML INJ
10.0000 mg | INTRAVENOUS | Status: DC | PRN
  Filled 2014-10-19: qty 2

## 2014-10-19 MED ORDER — OXYCODONE-ACETAMINOPHEN 5-325 MG PO TABS
2.0000 | ORAL_TABLET | ORAL | Status: DC | PRN
Start: 2014-10-19 — End: 2014-10-20

## 2014-10-19 MED ORDER — ACETAMINOPHEN 325 MG PO TABS: 650.0000 mg | ORAL_TABLET | ORAL | Status: DC | PRN

## 2014-10-19 MED ORDER — ONDANSETRON HCL 4 MG/2ML IJ SOLN
4.0000 mg | Freq: Four times a day (QID) | INTRAMUSCULAR | Status: DC | PRN
  Administered 2014-10-19: 4 mg via INTRAVENOUS
  Filled 2014-10-19: qty 2

## 2014-10-19 MED ORDER — OXYTOCIN BOLUS FROM INFUSION: 500.0000 mL | INTRAVENOUS | Status: DC

## 2014-10-19 MED ORDER — DIPHENHYDRAMINE HCL 50 MG/ML IJ SOLN: 12.5000 mg | INTRAMUSCULAR | Status: DC | PRN

## 2014-10-19 MED ORDER — DEXTROSE 5 % IV SOLN
5.0000 10*6.[IU] | Freq: Once | INTRAVENOUS | Status: AC
  Administered 2014-10-19: 5 10*6.[IU] via INTRAVENOUS
  Filled 2014-10-19: qty 5

## 2014-10-19 MED ORDER — LIDOCAINE HCL (PF) 1 % IJ SOLN
INTRAMUSCULAR | Status: DC | PRN
  Administered 2014-10-19: 4 mL
  Administered 2014-10-19: 3 mL

## 2014-10-19 MED ORDER — PENICILLIN G POTASSIUM 5000000 UNITS IJ SOLR
2.5000 10*6.[IU] | INTRAVENOUS | Status: DC
  Administered 2014-10-19 (×3): 2.5 10*6.[IU] via INTRAVENOUS
  Filled 2014-10-19 (×4): qty 2.5

## 2014-10-19 MED ORDER — OXYTOCIN 40 UNITS IN LACTATED RINGERS INFUSION - SIMPLE MED
62.5000 mL/h | INTRAVENOUS | Status: DC
  Filled 2014-10-19: qty 1000

## 2014-10-19 MED ORDER — TERBUTALINE SULFATE 1 MG/ML IJ SOLN: 0.2500 mg | Freq: Once | INTRAMUSCULAR | Status: AC | PRN

## 2014-10-19 MED ORDER — PHENYLEPHRINE 40 MCG/ML (10ML) SYRINGE FOR IV PUSH (FOR BLOOD PRESSURE SUPPORT)
80.0000 ug | PREFILLED_SYRINGE | INTRAVENOUS | Status: DC | PRN
  Filled 2014-10-19: qty 10
  Filled 2014-10-19: qty 2

## 2014-10-19 MED ORDER — OXYTOCIN 40 UNITS IN LACTATED RINGERS INFUSION - SIMPLE MED
1.0000 m[IU]/min | INTRAVENOUS | Status: DC
  Administered 2014-10-19: 2 m[IU]/min via INTRAVENOUS

## 2014-10-19 MED ORDER — LACTATED RINGERS IV SOLN
INTRAVENOUS | Status: DC
  Administered 2014-10-19 (×2): via INTRAVENOUS

## 2014-10-19 MED ORDER — FENTANYL 2.5 MCG/ML BUPIVACAINE 1/10 % EPIDURAL INFUSION (WH - ANES)
14.0000 mL/h | INTRAMUSCULAR | Status: DC | PRN
Start: 2014-10-19 — End: 2014-10-20
  Administered 2014-10-19 (×2): 14 mL/h via EPIDURAL
  Filled 2014-10-19 (×2): qty 125

## 2014-10-19 MED ORDER — LACTATED RINGERS IV SOLN: 500.0000 mL | INTRAVENOUS | Status: DC | PRN

## 2014-10-19 MED ORDER — OXYCODONE-ACETAMINOPHEN 5-325 MG PO TABS: 1.0000 | ORAL_TABLET | ORAL | Status: DC | PRN

## 2014-10-19 MED ORDER — LIDOCAINE HCL (PF) 1 % IJ SOLN
30.0000 mL | INTRAMUSCULAR | Status: DC | PRN
  Administered 2014-10-19: 30 mL via SUBCUTANEOUS
  Filled 2014-10-19: qty 30

## 2014-10-19 MED ORDER — FENTANYL 2.5 MCG/ML BUPIVACAINE 1/10 % EPIDURAL INFUSION (WH - ANES)
INTRAMUSCULAR | Status: DC | PRN
  Administered 2014-10-19: 13.5 mL/h via EPIDURAL

## 2014-10-19 MED ORDER — FLEET ENEMA 7-19 GM/118ML RE ENEM: 1.0000 | ENEMA | RECTAL | Status: DC | PRN

## 2014-10-19 MED ORDER — PHENYLEPHRINE 40 MCG/ML (10ML) SYRINGE FOR IV PUSH (FOR BLOOD PRESSURE SUPPORT)
80.0000 ug | PREFILLED_SYRINGE | INTRAVENOUS | Status: DC | PRN
  Filled 2014-10-19: qty 2

## 2014-10-19 NOTE — Anesthesia Preprocedure Evaluation (Signed)
Anesthesia Evaluation  Patient identified by MRN, date of birth, ID band Patient awake    Reviewed: Allergy & Precautions, H&P , Patient's Chart, lab work & pertinent test results  Airway Mallampati: II  TM Distance: >3 FB Neck ROM: Full    Dental no notable dental hx. (+) Teeth Intact   Pulmonary neg pulmonary ROS,  breath sounds clear to auscultation  Pulmonary exam normal       Cardiovascular negative cardio ROS  Rhythm:Regular Rate:Normal     Neuro/Psych negative neurological ROS  negative psych ROS   GI/Hepatic negative GI ROS, Neg liver ROS,   Endo/Other  Obesity PCOS  Renal/GU negative Renal ROS  negative genitourinary   Musculoskeletal   Abdominal (+) + obese,   Peds  Hematology negative hematology ROS (+)   Anesthesia Other Findings   Reproductive/Obstetrics (+) Pregnancy                             Anesthesia Physical Anesthesia Plan  ASA: II  Anesthesia Plan:    Post-op Pain Management:    Induction:   Airway Management Planned: Natural Airway  Additional Equipment:   Intra-op Plan:   Post-operative Plan:   Informed Consent: I have reviewed the patients History and Physical, chart, labs and discussed the procedure including the risks, benefits and alternatives for the proposed anesthesia with the patient or authorized representative who has indicated his/her understanding and acceptance.     Plan Discussed with: Anesthesiologist  Anesthesia Plan Comments:         Anesthesia Quick Evaluation

## 2014-10-19 NOTE — H&P (Signed)
Sophia Oneill is a 28 y.o. 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  OB History   Grav Para Term Preterm Abortions TAB SAB Ect Mult Living   2 0   1  1        Past Medical History  Diagnosis Date  . PCOS (polycystic ovarian syndrome) 02/2013  . Medical history non-contributory    Past Surgical History  Procedure Laterality Date  . No past surgeries    . Wisdom tooth extraction     Family History: family history includes Birth defects in her sister; Diabetes in her father and sister; Heart disease in her maternal grandfather; Hypertension in her father and mother. Social History:  reports that she has never smoked. She does not have any smokeless tobacco history on file. She reports that she does not drink alcohol or use illicit drugs.  No Known Allergies   Dilation: 8.5 Effacement (%): 100 Station: -1 Exam by:: h stone rnc AROM clear AF at about 12:45 pm.  FHR 125/min, accelerations present.  Occasional mild variable decelerations. UCs q2-3 min +++ Well under epidural  Blood pressure 153/92, pulse 122, temperature 98.6 F (37 C), temperature source Oral, resp. rate 18, height 5\' 3"  (1.6 m), weight 80.74 kg (178 lb), last menstrual period 01/13/2014. Exam Physical Exam  HPP:  Patient Active Problem List   Diagnosis Date Noted  . PCOS (polycystic ovarian syndrome) 11/04/2013    Prenatal labs: ABO, Rh: --/--/O NEG, O NEG (10/26 0740) Antibody: NEG (10/26 0740) Rubella:  Immune RPR: NON REAC (10/26 0740)  HBsAg: Negative (05/20 0000)  HIV: Non-reactive (05/20 0000)  Genetic testing: not done US anato: wnl 1 hr GTT: abnormal, 3 hr GTT wnl GBS: Negative, Positive (09/22 0000)   Assessment/Plan: 39 6/7 wks with severe Diastasis of Symphysis Pubis for Induction.  Good progression in active phase.  FHR monitoring reassuring, Cat. I.  Epidural.  Expectant management towards probable Vaginal  delivery.   Sophia Oneill,MARIE-LYNE 10/19/2014, 10:08 PM

## 2014-10-19 NOTE — Anesthesia Procedure Notes (Signed)
Epidural Patient location during procedure: OB Start time: 10/19/2014 1:49 PM  Staffing Anesthesiologist: Margalit Leece A. Performed by: anesthesiologist   Preanesthetic Checklist Completed: patient identified, site marked, surgical consent, pre-op evaluation, timeout performed, IV checked, risks and benefits discussed and monitors and equipment checked  Epidural Patient position: sitting Prep: site prepped and draped and DuraPrep Patient monitoring: continuous pulse ox and blood pressure Approach: midline Location: L3-L4 Injection technique: LOR air  Needle:  Needle type: Tuohy  Needle gauge: 17 G Needle length: 9 cm and 9 Needle insertion depth: 5 cm cm Catheter type: closed end flexible Catheter size: 19 Gauge Catheter at skin depth: 10 cm Test dose: negative and Other  Assessment Events: blood not aspirated, injection not painful, no injection resistance, negative IV test and no paresthesia  Additional Notes Patient identified. Risks and benefits discussed including failed block, incomplete  Pain control, post dural puncture headache, nerve damage, paralysis, blood pressure Changes, nausea, vomiting, reactions to medications-both toxic and allergic and post Partum back pain. All questions were answered. Patient expressed understanding and wished to proceed. Sterile technique was used throughout procedure. Epidural site was Dressed with sterile barrier dressing. No paresthesias, signs of intravascular injection Or signs of intrathecal spread were encountered.  Patient was more comfortable after the epidural was dosed. Please see RN's note for documentation of vital signs and FHR which are stable.

## 2014-10-20 ENCOUNTER — Encounter (HOSPITAL_COMMUNITY): Payer: Self-pay

## 2014-10-20 LAB — CBC
HCT: 35.8 % — ABNORMAL LOW (ref 36.0–46.0)
Hemoglobin: 12 g/dL (ref 12.0–15.0)
MCH: 26.8 pg (ref 26.0–34.0)
MCHC: 33.5 g/dL (ref 30.0–36.0)
MCV: 80.1 fL (ref 78.0–100.0)
Platelets: 231 10*3/uL (ref 150–400)
RBC: 4.47 MIL/uL (ref 3.87–5.11)
RDW: 14.2 % (ref 11.5–15.5)
WBC: 17 10*3/uL — ABNORMAL HIGH (ref 4.0–10.5)

## 2014-10-20 MED ORDER — PRENATAL MULTIVITAMIN CH
1.0000 | ORAL_TABLET | Freq: Every day | ORAL | Status: DC
  Administered 2014-10-20 – 2014-10-21 (×2): 1 via ORAL
  Filled 2014-10-20 (×2): qty 1

## 2014-10-20 MED ORDER — TETANUS-DIPHTH-ACELL PERTUSSIS 5-2.5-18.5 LF-MCG/0.5 IM SUSP: 0.5000 mL | Freq: Once | INTRAMUSCULAR | Status: DC

## 2014-10-20 MED ORDER — OXYCODONE-ACETAMINOPHEN 5-325 MG PO TABS: 2.0000 | ORAL_TABLET | ORAL | Status: DC | PRN

## 2014-10-20 MED ORDER — OXYCODONE-ACETAMINOPHEN 5-325 MG PO TABS
1.0000 | ORAL_TABLET | ORAL | Status: DC | PRN
  Administered 2014-10-20: 1 via ORAL
  Filled 2014-10-20: qty 1

## 2014-10-20 MED ORDER — ZOLPIDEM TARTRATE 5 MG PO TABS
5.0000 mg | ORAL_TABLET | Freq: Every evening | ORAL | Status: DC | PRN
Start: 2014-10-20 — End: 2014-10-21

## 2014-10-20 MED ORDER — DIPHENHYDRAMINE HCL 25 MG PO CAPS: 25.0000 mg | ORAL_CAPSULE | Freq: Four times a day (QID) | ORAL | Status: DC | PRN

## 2014-10-20 MED ORDER — WITCH HAZEL-GLYCERIN EX PADS: 1.0000 "application " | MEDICATED_PAD | CUTANEOUS | Status: DC | PRN

## 2014-10-20 MED ORDER — ONDANSETRON HCL 4 MG PO TABS: 4.0000 mg | ORAL_TABLET | ORAL | Status: DC | PRN

## 2014-10-20 MED ORDER — RHO D IMMUNE GLOBULIN 1500 UNIT/2ML IJ SOSY
300.0000 ug | PREFILLED_SYRINGE | Freq: Once | INTRAMUSCULAR | Status: AC
  Administered 2014-10-20: 300 ug via INTRAMUSCULAR
  Filled 2014-10-20: qty 2

## 2014-10-20 MED ORDER — IBUPROFEN 600 MG PO TABS
600.0000 mg | ORAL_TABLET | Freq: Four times a day (QID) | ORAL | Status: DC
  Administered 2014-10-20 – 2014-10-21 (×6): 600 mg via ORAL
  Filled 2014-10-20 (×6): qty 1

## 2014-10-20 MED ORDER — LANOLIN HYDROUS EX OINT: TOPICAL_OINTMENT | CUTANEOUS | Status: DC | PRN

## 2014-10-20 MED ORDER — SIMETHICONE 80 MG PO CHEW: 80.0000 mg | CHEWABLE_TABLET | ORAL | Status: DC | PRN

## 2014-10-20 MED ORDER — ONDANSETRON HCL 4 MG/2ML IJ SOLN
4.0000 mg | INTRAMUSCULAR | Status: DC | PRN
Start: 2014-10-20 — End: 2014-10-21

## 2014-10-20 MED ORDER — DIBUCAINE 1 % RE OINT
1.0000 | TOPICAL_OINTMENT | RECTAL | Status: DC | PRN
Start: 2014-10-20 — End: 2014-10-21

## 2014-10-20 MED ORDER — SENNOSIDES-DOCUSATE SODIUM 8.6-50 MG PO TABS
2.0000 | ORAL_TABLET | ORAL | Status: DC
  Administered 2014-10-20: 2 via ORAL
  Filled 2014-10-20: qty 2

## 2014-10-20 MED ORDER — OXYTOCIN 40 UNITS IN LACTATED RINGERS INFUSION - SIMPLE MED: 62.5000 mL/h | INTRAVENOUS | Status: DC | PRN

## 2014-10-20 MED ORDER — BENZOCAINE-MENTHOL 20-0.5 % EX AERO
1.0000 "application " | INHALATION_SPRAY | CUTANEOUS | Status: DC | PRN
  Administered 2014-10-20: 1 via TOPICAL
  Filled 2014-10-20: qty 56

## 2014-10-20 NOTE — Lactation Note (Signed)
This note was copied from the chart of [name]. Lactation Consultation Note  Breastfeeding consultation services and support information given to patient.  Mom states baby is latching easily and nursing well.  Reviewed breastfeeding basics and cue based feeding.  Mom has her own DEBP and asking if she should use it.  Recommended mom post pump every 3 hours and give any EBM back to baby.  Mom will call out for feeding assist prn.  Patient Name: [name] ZOXWR'UToday's Date: 10/20/2014 Reason for consult: Initial assessment;Infant < 6lbs   Maternal Data Does the patient have breastfeeding experience prior to this delivery?: No  Feeding    LATCH Score/Interventions                      Lactation Tools Discussed/Used Pump Review: Setup, frequency, and cleaning;Milk Storage   Consult Status Consult Status: Follow-up Date: 10/21/14 Follow-up type: In-patient    Huston FoleyMOULDEN, Riad Wagley S 10/20/2014, 6:59 PM

## 2014-10-20 NOTE — Progress Notes (Signed)
PPD #1- SVD  Subjective:   Reports feeling well, sleepy Tolerating po/ No nausea or vomiting Bleeding is light Pain controlled with Motrin Up ad lib / ambulatory / voiding without problems Newborn: beastfeeding  / Circumcision: planning   Objective:   VS:  VS:  Filed Vitals:   10/20/14 0047 10/20/14 0130 10/20/14 0300 10/20/14 0800  BP: 135/64 139/79 132/78 114/52  Pulse: 92 105 86 63  Temp:  99.6 F (37.6 C) 98.2 F (36.8 C)   TempSrc:  Oral Oral   Resp: 18 18 18 18   Height:      Weight:        LABS:  Recent Labs  10/19/14 0740 10/20/14 0616  WBC 11.6* 17.0*  HGB 12.6 12.0  PLT 265 231   Blood type: --/--/O NEG (10/27 16100616) Rubella: Immune (05/20 0000)   I&O: Intake/Output     10/26 0701 - 10/27 0700 10/27 0701 - 10/28 0700   Urine (mL/kg/hr) 900 (0.5)    Blood 350 (0.2)    Total Output 1250     Net -1250          Urine Occurrence 1 x      Physical Exam: Alert and oriented x3 Abdomen: soft, non-tender, non-distended  Fundus: firm, non-tender, U-1 Perineum: Well approximated, no significant erythema, edema, or drainage; healing well. Lochia: small Extremities: No edema, no calf pain or tenderness    Assessment:  PPD #1 G2P1011/ S/P:induced vaginal, supraurethral laceration  Rh negative Doing well    Plan: Rhogam as indicated Continue routine post partum orders Anticipate D/C home tomorrow   Donette LarryBHAMBRI, Iridiana Fonner, N MSN, CNM 10/20/2014, 11:54 AM

## 2014-10-20 NOTE — Anesthesia Postprocedure Evaluation (Signed)
  Anesthesia Post-op Note  Patient: Sophia Oneill  Procedure(s) Performed: * No procedures listed *  Patient Location: Mother/Baby  Anesthesia Type:Epidural  Level of Consciousness: awake, alert , oriented and patient cooperative  Airway and Oxygen Therapy: Patient Spontanous Breathing  Post-op Pain: mild  Post-op Assessment: Post-op Vital signs reviewed, Patient's Cardiovascular Status Stable, Respiratory Function Stable, Patent Airway, No headache, No backache, No residual numbness and No residual motor weakness  Post-op Vital Signs: Reviewed and stable  Last Vitals:  Filed Vitals:   10/20/14 0800  BP: 114/52  Pulse: 63  Temp:   Resp: 18    Complications: No apparent anesthesia complications

## 2014-10-21 ENCOUNTER — Encounter (HOSPITAL_COMMUNITY): Payer: Self-pay

## 2014-10-21 LAB — RH IG WORKUP (INCLUDES ABO/RH)
ABO/RH(D): O NEG
Fetal Screen: NEGATIVE
GESTATIONAL AGE(WKS): 39.6
Unit division: 0

## 2014-10-21 MED ORDER — IBUPROFEN 600 MG PO TABS: 600.0000 mg | ORAL_TABLET | Freq: Four times a day (QID) | ORAL | Status: DC

## 2014-10-21 NOTE — Discharge Summary (Signed)
Obstetric Discharge Summary  Reason for Admission: induction of labor Prenatal Procedures: none Intrapartum Procedures: spontaneous vaginal delivery and GBS prophylaxis Postpartum Procedures: none Complications-Operative and Postpartum: periuretral repair Hemoglobin  Date Value Ref Range Status  10/20/2014 12.0  12.0 - 15.0 g/dL Final     HCT  Date Value Ref Range Status  10/20/2014 35.8* 36.0 - 46.0 % Final    Physical Exam:  General: alert, cooperative and no distress Lochia: appropriate Uterine Fundus: firm Incision: healing well DVT Evaluation: No evidence of DVT seen on physical exam.  Discharge Diagnoses: Term Pregnancy-delivered  Discharge Information: Date: 10/21/2014 Activity: pelvic rest Diet: routine Medications: PNV and Ibuprofen Condition: stable Instructions: refer to practice specific booklet Discharge to: home Follow-up Information   Follow up with LAVOIE,MARIE-LYNE, MD. Schedule an appointment as soon as possible for a visit in 6 weeks.   Specialty:  Obstetrics and Gynecology   Contact information:   [address] [place] [address] [phone]       Newborn Data: Live born female  Birth Weight: 5 lb 13.3 oz (2645 g) APGAR: 8, 9  Home with mother.  Sophia Oneill, Sophia Oneill 10/21/2014, 9:22 AM

## 2014-10-21 NOTE — Progress Notes (Signed)
PPD 2 SVD  S:  Reports feeling well - ready to go home             Tolerating po/ No nausea or vomiting             Bleeding is light             Pain controlled withMotrin only             Up ad lib / ambulatory / voiding QS  Newborn breast feeding   O:               VS: BP 121/62  Pulse 70  Temp(Src) 98 F (36.7 C) (Oral)  Resp 19  Ht 5\' 3"  (1.6 m)  Wt 80.74 kg (178 lb)  BMI 31.54 kg/m2  SpO2 100%  LMP 01/13/2014  Breastfeeding? Unknown   LABS:              Recent Labs  10/19/14 0740 10/20/14 0616  WBC 11.6* 17.0*  HGB 12.6 12.0  PLT 265 231               Blood type: --/--/O NEG (10/27 40980616)  Rubella: Immune (05/20 0000)                                 Physical Exam:             Alert and oriented X3  Abdomen: soft, non-tender, non-distended              Fundus: firm, non-tender, U-2  Perineum: no edema  Lochia: light  Extremities: noedema, no calf pain or tenderness    A: PPD # 2   Doing well - stable status  P: Routine post partum orders  DC home - [facility] booklet - instructions reviewed  Marlinda MikeBAILEY, Delcia Spitzley CNM, MSN, FACNM 10/21/2014, 9:18 AM

## 2014-10-26 ENCOUNTER — Encounter (HOSPITAL_COMMUNITY): Payer: Self-pay

## 2016-04-13 ENCOUNTER — Ambulatory Visit: Payer: Managed Care, Other (non HMO) | Admitting: Internal Medicine

## 2016-04-13 DIAGNOSIS — Z0289 Encounter for other administrative examinations: Secondary | ICD-10-CM

## 2016-05-10 ENCOUNTER — Telehealth: Payer: Self-pay | Admitting: Internal Medicine

## 2016-05-10 NOTE — Telephone Encounter (Signed)
PT needs her Metformin refilled, she knows she is due for an appt but could not get one until 7/18

## 2016-05-10 NOTE — Telephone Encounter (Signed)
Pt has not been seen since 03/05/14. Please advise if ok to refill?

## 2016-05-11 MED ORDER — METFORMIN HCL 500 MG PO TABS: 500.0000 mg | ORAL_TABLET | Freq: Every day | ORAL | Status: DC

## 2016-05-11 NOTE — Telephone Encounter (Signed)
Refill sent.

## 2016-05-11 NOTE — Telephone Encounter (Signed)
OK to refill for 2 mo.

## 2016-05-31 ENCOUNTER — Other Ambulatory Visit: Payer: Self-pay | Admitting: *Deleted

## 2016-05-31 NOTE — Telephone Encounter (Signed)
Opened encounter in error  

## 2016-07-11 ENCOUNTER — Encounter: Payer: Self-pay | Admitting: Internal Medicine

## 2016-07-11 ENCOUNTER — Ambulatory Visit (INDEPENDENT_AMBULATORY_CARE_PROVIDER_SITE_OTHER): Payer: 59 | Admitting: Internal Medicine

## 2016-07-11 VITALS — BP 134/90 | HR 89 | Ht 64.0 in | Wt 213.0 lb

## 2016-07-11 DIAGNOSIS — R635 Abnormal weight gain: Secondary | ICD-10-CM

## 2016-07-11 DIAGNOSIS — E282 Polycystic ovarian syndrome: Secondary | ICD-10-CM

## 2016-07-11 LAB — TSH: TSH: 1.3 u[IU]/mL (ref 0.35–4.50)

## 2016-07-11 LAB — T4, FREE: FREE T4: 0.69 ng/dL (ref 0.60–1.60)

## 2016-07-11 LAB — T3, FREE: T3 FREE: 3.2 pg/mL (ref 2.3–4.2)

## 2016-07-11 LAB — HEMOGLOBIN A1C: HEMOGLOBIN A1C: 5.7 % (ref 4.6–6.5)

## 2016-07-11 MED ORDER — METFORMIN HCL 500 MG PO TABS: 1000.0000 mg | ORAL_TABLET | Freq: Two times a day (BID) | ORAL | Status: DC

## 2016-07-11 NOTE — Progress Notes (Signed)
Patient ID: Sophia Oneill, female   DOB: 06/01/1986, 30 y.o.   MRN: [id number]  HPI: Sophia Oneill is a 30 y.o. female, initially referred by Dr Mitchel HonourMegan Morris for evaluation for PCOS. Last visit >2 years ago!   Reviewed history: She was dx with PCOS in 02/2013 >> weight gain and irregular menstrual cycles.She had TV U/S and was told she had ovarian cysts. She was started on OCP Sprintec >> stopped as she did not notice weight loss. She did not want to take them in the first place as she was trying to conceive >> stopped in 05/2013.  Reviewed and addended hx: Weight gain: - after she gave birth - no steroid use - no weight loss meds - Meals: - Breakfast: yoghurt, fruit - Lunch: fish/baked chicken/veggies - Dinner: salad/chicken/turkey burger - Snacks: 3: granola bars/fruit - Diets tried: low-carb diet - she meets with a nutritionist every month - Exercise: cardio, 1-3x a week  Fertility/Menstrual cycles: - + h/o ovarian cyst on right ovary - went to ED for AP in 01/2013 - children: 1 boy - 10/19/2014 - miscarriages: 1 in 2008, at that time she had regular menstrual cycles - contraception: no, tried to conceive for >1 year - ovulation strips positive - LMP 10/21/2013  Acne: - has acne on face >> saw dermatologist >> started adapalene gel and doxycyclene  Hirsutism: - chin, not much  Treatments tried: - on Metformin - did not try Spironolactone - did not try [place]  She continues Metformin 500 mg once a day last year. Stopped for 1 year >> started 2 mo ago.   She was on OCPs (Sprintec) and stopped 04/2016.  ROS: Constitutional: + weight gain, no fatigue, no subjective hyperthermia/hypothermia Eyes: no blurry vision, no xerophthalmia ENT: no sore throat, no nodules palpated in throat, no dysphagia/odynophagia, no hoarseness Cardiovascular: no CP/SOB/palpitations/leg swelling Respiratory: no cough/SOB Gastrointestinal: no N/V/D/C/+ heartburn Musculoskeletal: no  muscle/joint aches Skin: no acne, + excess hair growth Neurological: no tremors/numbness/tingling/dizziness, + HA   PE: BP 134/90 mmHg  Pulse 89  Ht 5\' 4"  (1.626 m)  Wt 213 lb (96.616 kg)  BMI 36.54 kg/m2  SpO2 97%  LMP 07/07/2016 Wt Readings from Last 3 Encounters:  07/11/16 213 lb (96.616 kg)  10/19/14 178 lb (80.74 kg)  04/13/14 189 lb (85.73 kg)   Constitutional: overweight, in NAD Eyes: PERRLA, EOMI, no exophthalmos ENT: moist mucous membranes, no thyromegaly, no cervical lymphadenopathy Cardiovascular: RRR, No MRG Respiratory: CTA B Gastrointestinal: abdomen soft, NT, ND, BS+ Musculoskeletal: no deformities, strength intact in all 4 Skin: moist, warm; no hair visible on chin (plucked), few acne scars on lower jaw B Neurological: no tremor with outstretched hands, DTR normal in all 4  ASSESSMENT: 1. PCOS  2. Weight gain  PLAN: 1. And 2. Pt with PCOS, returning after a long absence. She had a normal pregnancy since last visit (2015) but a slightly smaller baby). She came off Metformin since last visit, only restarted a low dose 2 mo ago. As she gained quite a large amt of weight after the pregnancy, I will increase the Metformin and also check TFTs. - she is off OCPs for 2 mo >> normal menses - will check a testosterone level and a HbA1c today - RTC in 6 mo  Office Visit on 07/11/2016  Component Date Value Ref Range Status  . Free T4 07/11/2016 0.69  0.60 - 1.60 ng/dL Final  . T3, Free 36/64/403407/18/2017 3.2  2.3 - 4.2 pg/mL Final  .  TSH 07/11/2016 1.30  0.35 - 4.50 uIU/mL Final  . Hgb A1c MFr Bld 07/11/2016 5.7  4.6 - 6.5 % Final  . Testosterone, Free, LCM/MS/MS 07/15/2016 4.1  0.2 - 5.0 pg/mL Final   Normal labs, except borderline HbA1c.

## 2016-07-11 NOTE — Patient Instructions (Signed)
Please increase metformin slowly (by 1 tab every 3-4 days) until you reach 1000 mg 2x a day.  Please stop at the lab.  Please come back for a follow-up appointment in 6 months.

## 2016-07-15 LAB — TESTOSTERONE, FREE, LC/MS/MS: TESTOSTERONE, FREEE, LC/MS/MS: 4.1 pg/mL (ref 0.2–5.0)

## 2016-09-07 ENCOUNTER — Emergency Department (HOSPITAL_COMMUNITY): Payer: No Typology Code available for payment source

## 2016-09-07 ENCOUNTER — Encounter (HOSPITAL_COMMUNITY): Payer: Self-pay | Admitting: Emergency Medicine

## 2016-09-07 ENCOUNTER — Emergency Department (HOSPITAL_COMMUNITY)
Admission: EM | Admit: 2016-09-07 | Discharge: 2016-09-07 | Disposition: A | Discharge status: Home / Self Care | Payer: No Typology Code available for payment source | Attending: Emergency Medicine | Admitting: Emergency Medicine

## 2016-09-07 DIAGNOSIS — Y9389 Activity, other specified: Secondary | ICD-10-CM | POA: Diagnosis not present

## 2016-09-07 DIAGNOSIS — Z7984 Long term (current) use of oral hypoglycemic drugs: Secondary | ICD-10-CM | POA: Insufficient documentation

## 2016-09-07 DIAGNOSIS — S9031XA Contusion of right foot, initial encounter: Secondary | ICD-10-CM

## 2016-09-07 DIAGNOSIS — Y999 Unspecified external cause status: Secondary | ICD-10-CM | POA: Insufficient documentation

## 2016-09-07 DIAGNOSIS — Y92410 Unspecified street and highway as the place of occurrence of the external cause: Secondary | ICD-10-CM | POA: Diagnosis not present

## 2016-09-07 DIAGNOSIS — S8001XA Contusion of right knee, initial encounter: Secondary | ICD-10-CM | POA: Diagnosis not present

## 2016-09-07 DIAGNOSIS — S161XXA Strain of muscle, fascia and tendon at neck level, initial encounter: Secondary | ICD-10-CM | POA: Diagnosis not present

## 2016-09-07 DIAGNOSIS — S169XXA Unspecified injury of muscle, fascia and tendon at neck level, initial encounter: Secondary | ICD-10-CM | POA: Diagnosis present

## 2016-09-07 MED ORDER — TRAMADOL HCL 50 MG PO TABS: 50.0000 mg | ORAL_TABLET | Freq: Four times a day (QID) | ORAL | 0 refills | Status: DC | PRN

## 2016-09-07 MED ORDER — IBUPROFEN 800 MG PO TABS: 800.0000 mg | ORAL_TABLET | Freq: Three times a day (TID) | ORAL | 0 refills | Status: DC

## 2016-09-07 MED ORDER — IBUPROFEN 800 MG PO TABS
800.0000 mg | ORAL_TABLET | Freq: Once | ORAL | Status: AC
  Administered 2016-09-07: 800 mg via ORAL
  Filled 2016-09-07: qty 1

## 2016-09-07 MED ORDER — TRAMADOL HCL 50 MG PO TABS
50.0000 mg | ORAL_TABLET | Freq: Once | ORAL | Status: AC
  Administered 2016-09-07: 50 mg via ORAL
  Filled 2016-09-07: qty 1

## 2016-09-07 NOTE — ED Notes (Signed)
Pt c/o of increasing neck pain and advised registration; RN applied Cervical collar and ordered c-spine Xray; pt denies numbness or tingling
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
## 2016-09-07 NOTE — ED Notes (Signed)
Pt called for vs, no response. ?
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
## 2017-01-11 ENCOUNTER — Ambulatory Visit: Payer: 59 | Admitting: Internal Medicine

## 2017-04-01 ENCOUNTER — Telehealth: Payer: 59 | Admitting: Family

## 2017-04-01 DIAGNOSIS — B9689 Other specified bacterial agents as the cause of diseases classified elsewhere: Secondary | ICD-10-CM

## 2017-04-01 DIAGNOSIS — J329 Chronic sinusitis, unspecified: Secondary | ICD-10-CM | POA: Diagnosis not present

## 2017-04-01 MED ORDER — LEVOFLOXACIN 500 MG PO TABS: 500.0000 mg | ORAL_TABLET | Freq: Every day | ORAL | 0 refills | Status: DC

## 2017-04-01 MED ORDER — PREDNISONE 5 MG PO TABS: 5.0000 mg | ORAL_TABLET | ORAL | 0 refills | Status: DC

## 2017-04-01 NOTE — Progress Notes (Signed)
We are sorry that you are not feeling well.  Here is how we plan to help!  *The area looks very swollen from the photos but it is difficult to tell from a computer image. If it becomes very difficult to swallow, you spike a fever over 101.4, or the swelling becomes more severe, please be seen face-to-face at Urgent Care this weekend. If this happens, it would need to be determined whether this is a tonsil infection or swollen lymph nodes.   Based on what you have shared with me it looks like you have sinusitis.  Sinusitis is inflammation and infection in the sinus cavities of the head.  Based on your presentation I believe you most likely have Acute Bacterial Sinusitis.  This is an infection caused by bacteria and is treated with antibiotics. I have prescribed Levofloxicin  by mouth once daily for 7 days. You may use an oral decongestant such as Mucinex D or if you have glaucoma or high blood pressure use plain Mucinex. Saline nasal spray help and can safely be used as often as needed for congestion.  If you develop worsening sinus pain, fever or notice severe headache and vision changes, or if symptoms are not better after completion of antibiotic, please schedule an appointment with a health care provider.    *Given your swelling, I have also prescribed a steroid dose pack.   Sinus infections are not as easily transmitted as other respiratory infection, however we still recommend that you avoid close contact with loved ones, especially the very young and elderly.  Remember to wash your hands thoroughly throughout the day as this is the number one way to prevent the spread of infection!  Home Care:  Only take medications as instructed by your medical team.  Complete the entire course of an antibiotic.  Do not take these medications with alcohol.  A steam or ultrasonic humidifier can help congestion.  You can place a towel over your head and breathe in the steam from hot water coming from a  faucet.  Avoid close contacts especially the very young and the elderly.  Cover your mouth when you cough or sneeze.  Always remember to wash your hands.  Get Help Right Away If:  You develop worsening fever or sinus pain.  You develop a severe head ache or visual changes.  Your symptoms persist after you have completed your treatment plan.  Make sure you  Understand these instructions.  Will watch your condition.  Will get help right away if you are not doing well or get worse.  Your e-visit answers were reviewed by a board certified advanced clinical practitioner to complete your personal care plan.  Depending on the condition, your plan could have included both over the counter or prescription medications.  If there is a problem please reply  once you have received a response from your provider.  Your safety is important to [place].  If you have drug allergies check your prescription carefully.    You can use MyChart to ask questions about today's visit, request a non-urgent call back, or ask for a work or school excuse for 24 hours related to this e-Visit. If it has been greater than 24 hours you will need to follow up with your provider, or enter a new e-Visit to address those concerns.  You will get an e-mail in the next two days asking about your experience.  I hope that your e-visit has been valuable and will speed your recovery. Thank  you for using e-visits.

## 2017-04-30 ENCOUNTER — Telehealth: Payer: 59 | Admitting: Family

## 2017-04-30 DIAGNOSIS — R112 Nausea with vomiting, unspecified: Secondary | ICD-10-CM

## 2017-04-30 MED ORDER — ONDANSETRON HCL 4 MG PO TABS: 4.0000 mg | ORAL_TABLET | Freq: Three times a day (TID) | ORAL | 0 refills | Status: DC | PRN

## 2017-04-30 MED FILL — ONDANSETRON HCL 4 MG TABLET: 4 | 7 days supply | Qty: 20 | Fill #0

## 2017-04-30 NOTE — Progress Notes (Signed)

## 2017-05-22 DIAGNOSIS — E282 Polycystic ovarian syndrome: Secondary | ICD-10-CM | POA: Diagnosis not present

## 2017-05-22 DIAGNOSIS — Z6838 Body mass index (BMI) 38.0-38.9, adult: Secondary | ICD-10-CM | POA: Diagnosis not present

## 2017-05-22 DIAGNOSIS — E669 Obesity, unspecified: Secondary | ICD-10-CM | POA: Diagnosis not present

## 2017-05-22 DIAGNOSIS — E559 Vitamin D deficiency, unspecified: Secondary | ICD-10-CM | POA: Diagnosis not present

## 2017-05-22 MED FILL — METFORMIN HCL ER 500 MG TAB: 500 | 90 days supply | Qty: 180 | Fill #0

## 2017-05-23 DIAGNOSIS — E282 Polycystic ovarian syndrome: Secondary | ICD-10-CM | POA: Diagnosis not present

## 2017-06-05 ENCOUNTER — Telehealth: Payer: 59 | Admitting: Family

## 2017-06-05 DIAGNOSIS — N760 Acute vaginitis: Secondary | ICD-10-CM | POA: Diagnosis not present

## 2017-06-05 DIAGNOSIS — B9689 Other specified bacterial agents as the cause of diseases classified elsewhere: Secondary | ICD-10-CM | POA: Diagnosis not present

## 2017-06-05 MED ORDER — METRONIDAZOLE 500 MG PO TABS: 500.0000 mg | ORAL_TABLET | Freq: Three times a day (TID) | ORAL | 0 refills | Status: DC

## 2017-06-05 NOTE — Progress Notes (Signed)

## 2017-06-06 MED FILL — metroNIDAZOLE 500 MG TABS: 500 | 7 days supply | Qty: 21 | Fill #0

## 2017-06-29 DIAGNOSIS — M545 Low back pain: Secondary | ICD-10-CM | POA: Diagnosis not present

## 2017-07-05 DIAGNOSIS — M545 Low back pain: Secondary | ICD-10-CM | POA: Diagnosis not present

## 2017-07-23 ENCOUNTER — Telehealth: Payer: 59 | Admitting: Family

## 2017-07-23 DIAGNOSIS — H60332 Swimmer's ear, left ear: Secondary | ICD-10-CM | POA: Diagnosis not present

## 2017-07-23 MED ORDER — NEOMYCIN-POLYMYXIN-HC 3.5-10000-1 OT SOLN: 4.0000 [drp] | Freq: Four times a day (QID) | OTIC | 0 refills | Status: DC

## 2017-07-23 MED FILL — NEO/POLYMYXIN/HC EAR SOLN: 3.5-10000-1 | 13 days supply | Qty: 10 | Fill #0

## 2017-07-23 NOTE — Progress Notes (Signed)
E Visit for Swimmer's Ear  We are sorry that you are not feeling well. Here is how we plan to help!  I have prescribed: Neomycin 0.35%, polymyxin B 10,000 units/mL, and hydrocortisone 0,5% otic solution 4 drops in affected ears four times a day until completed    In certain cases swimmer's ear may progress to a more serious bacterial infection of the middle or inner ear.  If you have a fever 102 and up and significantly worsening symptoms, this could indicate a more serious infection moving to the middle/inner and needs face to face evaluation in an office by a provider.  Your symptoms should improve over the next 3 days and should resolve in about 7 days.  HOME CARE:   Wash your hands frequently.  Do not place the tip of the bottle on your ear or touch it with your fingers.  You can take Acetominophen 650 mg every 4-6 hours as needed for pain.  If pain is severe or moderate, you can apply a heating pad (set on low) or hot water bottle (wrapped in a towel) to outer ear for 20 minutes.  This will also increase drainage.  Avoid ear plugs  Do not use Q-tips  After showers, help the water run out by tilting your head to one side.  GET HELP RIGHT AWAY IF:   Fever is over 102.2 degrees.  You develop progressive ear pain or hearing loss.  Ear symptoms persist longer than 3 days after treatment.  MAKE SURE YOU:   Understand these instructions.  Will watch your condition.  Will get help right away if you are not doing well or get worse.  TO PREVENT SWIMMER'S EAR:  Use a bathing cap or custom fitted swim molds to keep your ears dry.  Towel off after swimming to dry your ears.  Tilt your head or pull your earlobes to allow the water to escape your ear canal.  If there is still water in your ears, consider using a hairdryer on the lowest setting.  Thank you for choosing an e-visit. Your e-visit answers were reviewed by a board certified advanced clinical practitioner to  complete your personal care plan. Depending upon the condition, your plan could have included both over the counter or prescription medications. Please review your pharmacy choice. Be sure that the pharmacy you have chosen is open so that you can pick up your prescription now.  If there is a problem you may message your provider in MyChart to have the prescription routed to another pharmacy. Your safety is important to us. If you have drug allergies check your prescription carefully.  For the next 24 hours, you can use MyChart to ask questions about today's visit, request a non-urgent call back, or ask for a work or school excuse from your e-visit provider. You will get an email in the next two days asking about your experience. I hope that your e-visit has been valuable and will speed your recovery.      

## 2017-07-26 ENCOUNTER — Encounter (HOSPITAL_COMMUNITY): Payer: Self-pay

## 2017-07-26 ENCOUNTER — Emergency Department (HOSPITAL_COMMUNITY)
Admission: EM | Admit: 2017-07-26 | Discharge: 2017-07-26 | Disposition: A | Discharge status: Home / Self Care | Payer: 59 | Attending: Emergency Medicine | Admitting: Emergency Medicine

## 2017-07-26 DIAGNOSIS — R432 Parageusia: Secondary | ICD-10-CM | POA: Diagnosis not present

## 2017-07-26 DIAGNOSIS — R202 Paresthesia of skin: Secondary | ICD-10-CM | POA: Diagnosis not present

## 2017-07-26 DIAGNOSIS — Z79899 Other long term (current) drug therapy: Secondary | ICD-10-CM | POA: Diagnosis not present

## 2017-07-26 NOTE — ED Provider Notes (Signed)
[facility]-EMERGENCY DEPT Provider Note   CSN: [id number] Arrival date & time: 07/26/17  1137     History   Chief Complaint Chief Complaint  Patient presents with  . Tingling    HPI Sophia Oneill is a 31 y.o. female.  Patient presents with multiple symptoms. She has a history of PCOS and takes metformin. Patient states that she begin to have some pain and scant drainage from her left ear 4 days ago. She had an e-visit and was prescribed neomycin ear drops for presumed external otitis. The following day she noted that she could not taste on the left side of her tongue. She also had slight decreased sensation on left side of her face and noted that she cannot close her left eye all the way when she laughed. She also noted twitching of her left eyelid. In addition she has noted some black spots in her right eye vision. Today she had some tingling, but no loss of vision, in her left arm and shoulder. She did not hit her head. She has not had any vomiting or fevers. No nasal congestion or other URI symptoms. No chest pain or shortness of breath. No difficulty walking, numbness, tingling, or weakness in her legs. She has not dropped any objects red to alter her daily activities. No history of estrogen use. She denies history of diabetes, hypertension, hyperlipidemia. No facial droop, slurred speech or difficulty with speaking. No significant neck pain or back pain. No new medications.      Past Medical History:  Diagnosis Date  . Medical history non-contributory   . PCOS (polycystic ovarian syndrome) 02/2013    Patient Active Problem List   Diagnosis Date Noted  . Postpartum state 10/20/2014  . Postpartum care following vaginal delivery (10/26) 10/20/2014  . PCOS (polycystic ovarian syndrome) 11/04/2013    Past Surgical History:  Procedure Laterality Date  . NO PAST SURGERIES    . WISDOM TOOTH EXTRACTION      OB History    Gravida Para Term Preterm AB Living   2 1 1   1 1    SAB  TAB Ectopic Multiple Live Births   1       1       Home Medications    Prior to Admission medications   Medication Sig Start Date End Date Taking? Authorizing Provider  diphenhydrAMINE (BENADRYL) 25 MG tablet Take 25 mg by mouth at bedtime as needed for allergies.    [provider]  ibuprofen (ADVIL,MOTRIN) 800 MG tablet Take 1 tablet (800 mg total) by mouth 3 (three) times daily. 09/07/16   Jacalyn Lefevre, MD  metFORMIN (GLUCOPHAGE) 500 MG tablet Take 2 tablets (1,000 mg total) by mouth 2 (two) times daily with a meal. 07/11/16   Carlus Pavlov, MD  neomycin-polymyxin-hydrocortisone (CORTISPORIN) OTIC solution Place 4 drops into the left ear 4 (four) times daily. 07/23/17   Eulis Foster, FNP  ondansetron (ZOFRAN) 4 MG tablet Take 1 tablet (4 mg total) by mouth every 8 (eight) hours as needed for nausea or vomiting. 04/30/17   Jannifer Rodney A, FNP  predniSONE (DELTASONE) 5 MG tablet Take 1 tablet (5 mg total) by mouth as directed. sterapred generic taper (21) 04/01/17   [place]row, Everardo All, FNP    Family History Family History  Problem Relation Age of Onset  . Hypertension Mother   . Diabetes Father        DM Type II  . Hypertension Father   . Diabetes Sister   .  Birth defects Sister        cleft lip and palate  . Heart disease Maternal Grandfather     Social History Social History  Substance Use Topics  . Smoking status: Never Smoker  . Smokeless tobacco: Not on file  . Alcohol use No     Allergies   Amoxicillin and Penicillins   Review of Systems Review of Systems  Constitutional: Negative for fever.  HENT: Positive for ear discharge and ear pain. Negative for congestion, dental problem, rhinorrhea and sinus pressure.   Eyes: Positive for visual disturbance (blurry vision, black spots in right eye). Negative for photophobia, discharge and redness.  Respiratory: Negative for shortness of breath.   Cardiovascular: Negative for chest pain.  Gastrointestinal:  Negative for nausea and vomiting.  Musculoskeletal: Negative for gait problem, neck pain and neck stiffness.  Skin: Negative for rash.  Neurological: Negative for syncope, speech difficulty, weakness, light-headedness, numbness (Positive for paresthesias, no numbness) and headaches.  Psychiatric/Behavioral: Negative for confusion.     Physical Exam Updated Vital Signs BP (!) 149/91 (BP Location: Right Arm)   Pulse (!) 114   Temp 98.2 F (36.8 C) (Oral)   Resp 16   Ht 5\' 3"  (1.6 m)   Wt 95.3 kg (210 lb)   LMP 07/17/2017 (Exact Date)   SpO2 100%   Breastfeeding? No   BMI 37.20 kg/m   Physical Exam  Constitutional: She is oriented to person, place, and time. She appears well-developed and well-nourished.  HENT:  Head: Normocephalic and atraumatic.  Right Ear: Tympanic membrane, external ear and ear canal normal.  Left Ear: Tympanic membrane, external ear and ear canal normal.  Nose: Nose normal.  Mouth/Throat: Uvula is midline, oropharynx is clear and moist and mucous membranes are normal.  Bilateral ear canals are normal in appearance without signs of otitis media.  Eyes: Pupils are equal, round, and reactive to light. Conjunctivae, EOM and lids are normal. Right eye exhibits no nystagmus. Left eye exhibits no nystagmus.  Neck: Normal range of motion. Neck supple.  Cardiovascular: Normal rate and regular rhythm.   No murmur heard. No tachycardia time of exam.  Pulmonary/Chest: Effort normal and breath sounds normal.  Abdominal: Soft. There is no tenderness.  Musculoskeletal:       Cervical back: She exhibits normal range of motion, no tenderness and no bony tenderness.  Neurological: She is alert and oriented to person, place, and time. She has normal strength and normal reflexes. A cranial nerve deficit (Reports dysgeusia, decreased sensation L face) is present. No sensory deficit. She exhibits normal muscle tone. She displays a negative Romberg sign. Coordination and gait  normal. GCS eye subscore is 4. GCS verbal subscore is 5. GCS motor subscore is 6.  No facial weakness. For her muscles are spared.  Skin: Skin is warm and dry.  Psychiatric: She has a normal mood and affect.  Nursing note and vitals reviewed.     Procedures Procedures (including critical care time)  Medications Ordered in ED Medications - No data to display   Initial Impression / Assessment and Plan / ED Course  I have reviewed the triage vital signs and the nursing notes.  Pertinent labs & imaging results that were available during my care of the patient were reviewed by me and considered in my medical decision making (see chart for details).     Patient seen and examined.   Vital signs reviewed and are as follows: BP (!) 149/91 (BP Location: Right Arm)  Pulse (!) 114   Temp 98.2 F (36.8 C) (Oral)   Resp 16   Ht 5\' 3"  (1.6 m)   Wt 95.3 kg (210 lb)   LMP 07/17/2017 (Exact Date)   SpO2 100%   Breastfeeding? No   BMI 37.20 kg/m   I do not feel that the patient has a stroke today or any emergent neurological conditions. I do feel that she needs to follow-up with a neurologist and a referral was made on her behalf.   Patient counseled to return if they have weakness in their arms or legs, slurred speech, trouble walking or talking, confusion, trouble with their balance, or if they have any other concerns. Patient verbalizes understanding and agrees with plan.    Final Clinical Impressions(s) / ED Diagnoses   Final diagnoses:  Paresthesia  Dysgeusia   Patient with history of ear pain, decreased sensation in face, eye twitching, decreased taste, paresthesias of left arm. Patient does not have any objective neurological deficits is today to suggest CVA. She is not a headache to suggest venous sinus thrombosis. I doubt central neurological cause. She may need workup for multiple sclerosis if symptoms continue. She does not have any debilitating vision changes, but does  report dark spots in her right visual gaze. Do not feel that any emergent workup including head CT, lab work would be beneficial at this time. Like patient to follow-up with a neurologist for an appropriate thorough workup of her symptoms. Referral made. Return instructions as above if symptoms change or worsen.  New Prescriptions New Prescriptions   No medications on file     Renne CriglerGeiple, Cicilia Clinger, Cordelia Poche-C 07/26/17 1505    Geoffery Lyonselo, [place], MD 07/26/17 [phone]

## 2017-07-26 NOTE — ED Notes (Signed)
Pt c/o left eye twitching since Tuesday, and left side of mouth tingling today-- also states has had diminished taste of left side of tongue.  Does not smoke, no birth control use.

## 2017-07-26 NOTE — Discharge Instructions (Signed)
Please read and follow all provided instructions.  Your diagnoses today include:  1. Paresthesia   2. Dysgeusia     Tests performed today include:  Vital signs. See below for your results today.   Medications:   None  Take any prescribed medications only as directed.  Additional information:  You will need to follow-up with neurologist for further evaluation of your symptoms today.  Follow any educational materials contained in this packet.  Follow-up instructions: Please follow-up with the neurologist listed in the next 1-2 weeks..   Return instructions:   Please return to the Emergency Department if you experience worsening symptoms.  Return with multiple episodes of vomiting or cannot keep down fluids.  RETURN IMMEDIATELY IF you:  Develop a sudden, severe headache  Develop confusion or become poorly responsive or faint  Develop a fever above 100.21F or problem breathing  Have a change in speech, vision, swallowing, or understanding  Develop new weakness, numbness, tingling, incoordination in your arms or legs  Have a seizure  Please return if you have any other emergent concerns.  Additional Information:  Your vital signs today were: BP (!) 149/91 (BP Location: Right Arm)    Pulse (!) 114    Temp 98.2 F (36.8 C) (Oral)    Resp 16    Ht 5\' 3"  (1.6 m)    Wt 95.3 kg (210 lb)    LMP 07/17/2017 (Exact Date)    SpO2 100%    Breastfeeding? No    BMI 37.20 kg/m  If your blood pressure (BP) was elevated above 135/85 this visit, please have this repeated by your doctor within one month. --------------

## 2017-07-26 NOTE — ED Triage Notes (Signed)
Pt endorses having an ear infection that began Sunday which she was treated for with drops, then on Monday pt states that she lost the taste on the left side of her tongue on Monday and began having left arm tingling this morning. Pt states "my left arm just feels like it's asleep"

## 2017-07-30 DIAGNOSIS — M545 Low back pain: Secondary | ICD-10-CM | POA: Diagnosis not present

## 2017-08-04 ENCOUNTER — Telehealth: Payer: 59 | Admitting: Nurse Practitioner

## 2017-08-04 DIAGNOSIS — J309 Allergic rhinitis, unspecified: Secondary | ICD-10-CM | POA: Diagnosis not present

## 2017-08-04 MED ORDER — FLUTICASONE PROPIONATE 50 MCG/ACT NA SUSP: 2.0000 | Freq: Every day | NASAL | 6 refills | Status: DC

## 2017-08-04 NOTE — Progress Notes (Signed)
E visit for Allergic Rhinitis We are sorry that you are not feeling well.  Her is how we plan to help!  Based on what you have shared with me it looks like you have Allergic Rhinitis.  Rhinitis is when a reaction occurs that causes nasal congestion, runny nose, sneezing, and itching.  Most types of rhinitis are caused by an inflammation and are associated with symptoms in the eyes ears or throat. There are several types of rhinitis.  The most common are acute rhinitis, which is usually caused by a viral illness, allergic or seasonal rhinitis, and nonallergic or year-round rhinitis.  Nasal allergies occur certain times of the year.  Allergic rhinitis is caused when allergens in the air trigger the release of histamine in the body.  Histamine causes itching, swelling, and fluid to build up in the fragile linings of the nasal passages, sinuses and eyelids.  An itchy nose and clear discharge are common.  I recommend the following over the counter treatments: Clarinex 5 mg take 1 tablet daily 9Cannot take if pregnant or breastfeeding)  I also would recommend a nasal spray: Flonase 2 sprays into each nostril once daily. Prescription sent to pharmacy  You may also benefit from eye drops such as: Systane 1-2 driops each eye twice daily as needed  HOME CARE:   You can use an over-the-counter saline nasal spray as needed  Avoid areas where there is heavy dust, mites, or molds  Stay indoors on windy days during the pollen season  Keep windows closed in home, at least in bedroom; use air conditioner.  Use high-efficiency house air filter  Keep windows closed in car, turn AC on re-circulate  Avoid playing out with dog during pollen season  GET HELP RIGHT AWAY IF:   If your symptoms do not improve within 10 days  You become short of breath  You develop yellow or green discharge from your nose for over 3 days  You have coughing fits  MAKE SURE YOU:   Understand these  instructions  Will watch your condition  Will get help right away if you are not doing well or get worse  Thank you for choosing an e-visit. Your e-visit answers were reviewed by a board certified advanced clinical practitioner to complete your personal care plan. Depending upon the condition, your plan could have included both over the counter or prescription medications. Please review your pharmacy choice. Be sure that the pharmacy you have chosen is open so that you can pick up your prescription now.  If there is a problem you may message your provider in MyChart to have the prescription routed to another pharmacy. Your safety is important to us. If you have drug allergies check your prescription carefully.  For the next 24 hours, you can use MyChart to ask questions about today's visit, request a non-urgent call back, or ask for a work or school excuse from your e-visit provider. You will get an email in the next two days asking about your experience. I hope that your e-visit has been valuable and will speed your recovery.

## 2017-08-29 DIAGNOSIS — M545 Low back pain: Secondary | ICD-10-CM | POA: Diagnosis not present

## 2017-08-29 DIAGNOSIS — M5416 Radiculopathy, lumbar region: Secondary | ICD-10-CM | POA: Diagnosis not present

## 2017-09-11 ENCOUNTER — Telehealth: Payer: 59 | Admitting: Family

## 2017-09-11 DIAGNOSIS — B9689 Other specified bacterial agents as the cause of diseases classified elsewhere: Secondary | ICD-10-CM

## 2017-09-11 DIAGNOSIS — M545 Low back pain: Secondary | ICD-10-CM | POA: Diagnosis not present

## 2017-09-11 DIAGNOSIS — J019 Acute sinusitis, unspecified: Secondary | ICD-10-CM

## 2017-09-11 MED ORDER — DOXYCYCLINE HYCLATE 100 MG PO TABS: 100.0000 mg | ORAL_TABLET | Freq: Two times a day (BID) | ORAL | 0 refills | Status: DC

## 2017-09-11 MED FILL — DOXYCYCLINE HYCLATE 100 MG: 100 | 10 days supply | Qty: 20 | Fill #0

## 2017-09-11 NOTE — Progress Notes (Signed)

## 2017-09-12 ENCOUNTER — Telehealth: Payer: Self-pay | Admitting: *Deleted

## 2017-09-12 ENCOUNTER — Ambulatory Visit: Payer: 59 | Admitting: Neurology

## 2017-09-12 NOTE — Telephone Encounter (Signed)
No showed new patient appointment. 

## 2017-09-13 ENCOUNTER — Encounter: Payer: Self-pay | Admitting: Neurology

## 2017-10-26 ENCOUNTER — Telehealth: Payer: 59 | Admitting: Family

## 2017-10-26 DIAGNOSIS — M545 Low back pain: Secondary | ICD-10-CM | POA: Diagnosis not present

## 2017-10-26 MED ORDER — ETODOLAC 300 MG PO CAPS: 300.0000 mg | ORAL_CAPSULE | Freq: Two times a day (BID) | ORAL | 0 refills | Status: DC

## 2017-10-26 MED ORDER — BACLOFEN 10 MG PO TABS: 10.0000 mg | ORAL_TABLET | Freq: Three times a day (TID) | ORAL | 0 refills | Status: DC | PRN

## 2017-10-26 MED FILL — BACLOFEN 10 MG TABLET: 10 | 10 days supply | Qty: 30 | Fill #0

## 2017-10-26 MED FILL — ETODOLAC 300 MG CAPSULE: 300 | 10 days supply | Qty: 20 | Fill #0

## 2017-10-26 NOTE — Progress Notes (Signed)

## 2017-10-28 ENCOUNTER — Telehealth: Payer: 59 | Admitting: Family

## 2017-10-28 DIAGNOSIS — J111 Influenza due to unidentified influenza virus with other respiratory manifestations: Secondary | ICD-10-CM

## 2017-10-28 DIAGNOSIS — R6889 Other general symptoms and signs: Secondary | ICD-10-CM | POA: Diagnosis not present

## 2017-10-28 MED ORDER — BENZONATATE 100 MG PO CAPS: 100.0000 mg | ORAL_CAPSULE | Freq: Three times a day (TID) | ORAL | 0 refills | Status: DC | PRN

## 2017-10-28 MED ORDER — ALBUTEROL SULFATE HFA 108 (90 BASE) MCG/ACT IN AERS: 2.0000 | INHALATION_SPRAY | Freq: Four times a day (QID) | RESPIRATORY_TRACT | 0 refills | Status: AC | PRN

## 2017-10-28 MED ORDER — OSELTAMIVIR PHOSPHATE 75 MG PO CAPS: 75.0000 mg | ORAL_CAPSULE | Freq: Two times a day (BID) | ORAL | 0 refills | Status: DC

## 2017-10-28 NOTE — Progress Notes (Signed)
Thank you for the details you included in the comment boxes. Those details are very helpful in determining the best course of treatment for you and help [place] to provide the best care.  E visit for Flu like symptoms   We are sorry that you are not feeling well.  Here is how we plan to help! Based on what you have shared with me it looks like you may have a respiratory virus that may be influenza.  Influenza or "the flu" is   an infection caused by a respiratory virus. The flu virus is highly contagious and persons who did not receive their yearly flu vaccination may "catch" the flu from close contact.  We have anti-viral medications to treat the viruses that cause this infection. They are not a "cure" and only shorten the course of the infection. These prescriptions are most effective when they are given within the first 2 days of "flu" symptoms. Antiviral medication are indicated if you have a high risk of complications from the flu. You should  also consider an antiviral medication if you are in close contact with someone who is at risk. These medications can help patients avoid complications from the flu  but have side effects that you should know. Possible side effects from Tamiflu or oseltamivir include nausea, vomiting, diarrhea, dizziness, headaches, eye redness, sleep problems or other respiratory symptoms. You should not take Tamiflu if you have an allergy to oseltamivir or any to the ingredients in Tamiflu.  Based upon your symptoms and potential risk factors I have prescribed Oseltamivir (Tamiflu).  It has been sent to your designated pharmacy.  You will take one 75 mg capsule orally twice a day for the next 5 days.   I also sent tessalon perles 100mg  (take 1-2 every 8 hrs for cough as needed);  in addition to an albuterol inhaler due to difficulty breathing.   ANYONE WHO HAS FLU SYMPTOMS SHOULD: . Stay home. The flu is highly contagious and going out or to work exposes others! . Be sure to  drink plenty of fluids. Water is fine as well as fruit juices, sodas and electrolyte beverages. You may want to stay away from caffeine or alcohol. If you are nauseated, try taking small sips of liquids. How do you know if you are getting enough fluid? Your urine should be a pale yellow or almost colorless. . Get rest. . Taking a steamy shower or using a humidifier may help nasal congestion and ease sore throat pain. Using a saline nasal spray works much the same way. . Cough drops, hard candies and sore throat lozenges may ease your cough. . Line up a caregiver. Have someone check on you regularly.   GET HELP RIGHT AWAY IF: . You cannot keep down liquids or your medications. . You become short of breath . Your fell like you are going to pass out or loose consciousness. . Your symptoms persist after you have completed your treatment plan MAKE SURE YOU   Understand these instructions.  Will watch your condition.  Will get help right away if you are not doing well or get worse.  Your e-visit answers were reviewed by a board certified advanced clinical practitioner to complete your personal care plan.  Depending on the condition, your plan could have included both over the counter or prescription medications.  If there is a problem please reply  once you have received a response from your provider.  Your safety is important to [place].  If  you have drug allergies check your prescription carefully.    You can use MyChart to ask questions about today's visit, request a non-urgent call back, or ask for a work or school excuse for 24 hours related to this e-Visit. If it has been greater than 24 hours you will need to follow up with your provider, or enter a new e-Visit to address those concerns.  You will get an e-mail in the next two days asking about your experience.  I hope that your e-visit has been valuable and will speed your recovery. Thank you for using e-visits.
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
## 2017-11-28 ENCOUNTER — Telehealth: Payer: 59 | Admitting: Family

## 2017-11-28 DIAGNOSIS — T7840XA Allergy, unspecified, initial encounter: Secondary | ICD-10-CM | POA: Diagnosis not present

## 2017-11-28 DIAGNOSIS — L509 Urticaria, unspecified: Secondary | ICD-10-CM | POA: Diagnosis not present

## 2017-11-28 NOTE — Progress Notes (Signed)
E Visit for Rash  We are sorry that you are not feeling well. Here is how we plan to help!  Based on what you shared with me you may have  an allergic reaction.  I recommend you take Benadryl 25 mg - 50 mg every 4 hours to control the symptoms but if they last over 24 hours it is best that you see an office based provider for follow up.  Also would benefit from daily Zyrtec. Have you started any new medications, new foods, or changed lotions or soaps? These can be triggers. If the rash does not resolve you will need to be seen face to face to be evaluated.   Any swelling of face, mouth, shortness of breath, or chest discomfort you need to go to the ED.  HOME CARE:   Take cool showers and avoid direct sunlight.  Apply cool compress or wet dressings.  Take a bath in an oatmeal bath.  Sprinkle content of one Aveeno packet under running faucet with comfortably warm water.  Bathe for 15-20 minutes, 1-2 times daily.  Pat dry with a towel. Do not rub the rash.  Use hydrocortisone cream.  Take an antihistamine like Benadryl for widespread rashes that itch.  The adult dose of Benadryl is 25-50 mg by mouth 4 times daily.  Caution:  This type of medication may cause sleepiness.  Do not drink alcohol, drive, or operate dangerous machinery while taking antihistamines.  Do not take these medications if you have prostate enlargement.  Read package instructions thoroughly on all medications that you take.  GET HELP RIGHT AWAY IF:   Symptoms don't go away after treatment.  Severe itching that persists.  If you rash spreads or swells.  If you rash begins to smell.  If it blisters and opens or develops a yellow-brown crust.  You develop a fever.  You have a sore throat.  You become short of breath.  MAKE SURE YOU:  Understand these instructions. Will watch your condition. Will get help right away if you are not doing well or get worse.  Thank you for choosing an e-visit. Your e-visit  answers were reviewed by a board certified advanced clinical practitioner to complete your personal care plan. Depending upon the condition, your plan could have included both over the counter or prescription medications. Please review your pharmacy choice. Be sure that the pharmacy you have chosen is open so that you can pick up your prescription now.  If there is a problem you may message your provider in MyChart to have the prescription routed to another pharmacy. Your safety is important to us. If you have drug allergies check your prescription carefully.  For the next 24 hours, you can use MyChart to ask questions about today's visit, request a non-urgent call back, or ask for a work or school excuse from your e-visit provider. You will get an email in the next two days asking about your experience. I hope that your e-visit has been valuable and will speed your recovery.

## 2017-12-16 ENCOUNTER — Telehealth: Payer: 59 | Admitting: Physician Assistant

## 2017-12-16 DIAGNOSIS — L700 Acne vulgaris: Secondary | ICD-10-CM | POA: Diagnosis not present

## 2017-12-16 MED ORDER — CLINDAMYCIN PHOS-BENZOYL PEROX 1-5 % EX GEL: Freq: Two times a day (BID) | CUTANEOUS | 0 refills | Status: DC

## 2017-12-16 NOTE — Progress Notes (Signed)
We are sorry that you are experiencing this issue.  Here is how we plan to help!  Based on what you shared with me it looks like you have severe acne.  Acne is a disorder of the hair follicles and oil glands (sebaceous glands). The sebaceous glands secrete oils to keep the skin moist.  When the glands get clogged, it can lead to pimples or cysts.  These cysts may become infected and leave scars. Acne is very common and normally occurs at puberty.  Acne is also inherited.  Your personal care plan consists of the following recommendations:  I recommend that you use a daily cleanser  You might try an over the counter cleanser that has benzoyl peroxide.  I recommend that you start with a product that has 2.5% benzoyl peroxide.  Stronger concentrations have not been shown to be more effective.  I have prescribed a topical gel with an antibiotic:  Clindamycin-benzoyl peroxide gel.  This gel should be applied to the affected areas twice a day.  Be sure to read the package insert to understand potential side effects.  Giving your mention of PCOS, I would also recommend you see your primary care provider or GYN to discuss the potential use of Spironolactone to help as well.   If excessive dryness or peeling occurs, reduce dose frequency or concentration of the topical scrubs.  If excessive stinging or burning occurs, remove the topical gel with mild soap and water and resume at a lower dose the next day.  Remember oral antibiotics and topical acne treatments may increase your sensitivity to the sun!  HOME CARE:  Do not squeeze pimples because that can often lead to infections, worse acne, and scars.  Use a moisturizer that contains retinoid or fruit acids that may inhibit the development of new acne lesions.  Although there is not a clear link that foods can cause acne, doctors do believe that too many sweets predispose you to skin problems.  GET HELP RIGHT AWAY IF:  If your acne gets worse or is  not better within 10 days.  If you become depressed.  If you become pregnant, discontinue medications and call your OB/GYN.  MAKE SURE YOU:  Understand these instructions.  Will watch your condition.  Will get help right away if you are not doing well or get worse.   Your e-visit answers were reviewed by a board certified advanced clinical practitioner to complete your personal care plan.  Depending upon the condition, your plan could have included both over the counter or prescription medications.  Please review your pharmacy choice.  If there is a problem, you may contact your provider through [employer] messaging and have the prescription routed to another pharmacy.  Your safety is important to us.  If you have drug allergies check your prescription carefully.  For the next 24 hours you can use MyChart to ask questions about today's visit, request a non-urgent call back, or ask for a work or school excuse from your e-visit provider.  You will get an email in the next two days asking about your experience. I hope that your e-visit has been valuable and will speed your recovery.

## 2017-12-17 MED FILL — CLINDAMYCIN-BENZOYL PEROX 1: 1-5 | 30 days supply | Qty: 25 | Fill #0

## 2017-12-27 ENCOUNTER — Telehealth: Payer: No Typology Code available for payment source | Admitting: Family

## 2017-12-27 DIAGNOSIS — R112 Nausea with vomiting, unspecified: Secondary | ICD-10-CM | POA: Diagnosis not present

## 2017-12-27 MED ORDER — ONDANSETRON 4 MG PO TBDP: 4.0000 mg | ORAL_TABLET | Freq: Three times a day (TID) | ORAL | 0 refills | Status: DC | PRN

## 2017-12-27 MED FILL — ONDANSETRON ODT 4 MG TABLET: 4 | 6 days supply | Qty: 20 | Fill #0

## 2017-12-27 NOTE — Progress Notes (Signed)
Thank you for the details you included in the comment boxes. Those details are very helpful in determining the best course of treatment for you and help us to provide the best care.  We are sorry that you are not feeling well. Here is how we plan to help!  Based on what you have shared with me it looks like you have a Virus that is irritating your GI tract.  Vomiting is the forceful emptying of a portion of the stomach's content through the mouth.  Although nausea and vomiting can make you feel miserable, it's important to remember that these are not diseases, but rather symptoms of an underlying illness.  When we treat short term symptoms, we always caution that any symptoms that persist should be fully evaluated in a medical office.  I have prescribed a medication that will help alleviate your symptoms and allow you to stay hydrated:  Zofran 4 mg 1 tablet every 8 hours as needed for nausea and vomiting  HOME CARE:  Drink clear liquids.  This is very important! Dehydration (the lack of fluid) can lead to a serious complication.  Start off with 1 tablespoon every 5 minutes for 8 hours.  You may begin eating bland foods after 8 hours without vomiting.  Start with saltine crackers, white bread, rice, mashed potatoes, applesauce.  After 48 hours on a bland diet, you may resume a normal diet.  Try to go to sleep.  Sleep often empties the stomach and relieves the need to vomit.  GET HELP RIGHT AWAY IF:   Your symptoms do not improve or worsen within 2 days after treatment.  You have a fever for over 3 days.  You cannot keep down fluids after trying the medication.  MAKE SURE YOU:   Understand these instructions.  Will watch your condition.  Will get help right away if you are not doing well or get worse.   Thank you for choosing an e-visit. Your e-visit answers were reviewed by a board certified advanced clinical practitioner to complete your personal care plan. Depending upon the  condition, your plan could have included both over the counter or prescription medications. Please review your pharmacy choice. Be sure that the pharmacy you have chosen is open so that you can pick up your prescription now.  If there is a problem you may message your provider in MyChart to have the prescription routed to another pharmacy. Your safety is important to us. If you have drug allergies check your prescription carefully.  For the next 24 hours, you can use MyChart to ask questions about today's visit, request a non-urgent call back, or ask for a work or school excuse from your e-visit provider. You will get an e-mail in the next two days asking about your experience. I hope that your e-visit has been valuable and will speed your recovery.    

## 2018-01-26 ENCOUNTER — Telehealth: Payer: No Typology Code available for payment source | Admitting: Nurse Practitioner

## 2018-01-26 DIAGNOSIS — B373 Candidiasis of vulva and vagina: Secondary | ICD-10-CM

## 2018-01-26 DIAGNOSIS — B3731 Acute candidiasis of vulva and vagina: Secondary | ICD-10-CM

## 2018-01-26 MED ORDER — FLUCONAZOLE 150 MG PO TABS: 150.0000 mg | ORAL_TABLET | Freq: Once | ORAL | 0 refills | Status: AC

## 2018-01-26 NOTE — Progress Notes (Signed)

## 2018-02-15 ENCOUNTER — Telehealth: Payer: No Typology Code available for payment source | Admitting: Family

## 2018-02-15 DIAGNOSIS — J029 Acute pharyngitis, unspecified: Secondary | ICD-10-CM | POA: Diagnosis not present

## 2018-02-15 MED ORDER — PREDNISONE 5 MG PO TABS: 5.0000 mg | ORAL_TABLET | ORAL | 0 refills | Status: DC

## 2018-02-15 MED ORDER — BENZONATATE 100 MG PO CAPS: 100.0000 mg | ORAL_CAPSULE | Freq: Three times a day (TID) | ORAL | 0 refills | Status: DC | PRN

## 2018-02-15 MED FILL — predniSONE 5 MG TABS: 5 | 6 days supply | Qty: 21 | Fill #0

## 2018-02-15 MED FILL — BENZONATATE 100 MG CAPS: 100 | 5 days supply | Qty: 30 | Fill #0

## 2018-02-15 NOTE — Progress Notes (Signed)
Thank you for the details you included in the comment boxes. Those details are very helpful in determining the best course of treatment for you and help us to provide the best care.  We are sorry that you are not feeling well.  Here is how we plan to help!  Based on your presentation I believe you most likely have A cough due to a virus.  This is called viral bronchitis and is best treated by rest, plenty of fluids and control of the cough.  You may use Ibuprofen or Tylenol as directed to help your symptoms.     In addition you may use A non-prescription cough medication called Mucinex DM: take 2 tablets every 12 hours. and A prescription cough medication called Tessalon Perles 100mg. You may take 1-2 capsules every 8 hours as needed for your cough.  Sterapred 5 mg dosepak  From your responses in the eVisit questionnaire you describe inflammation in the upper respiratory tract which is causing a significant cough.  This is commonly called Bronchitis and has four common causes:    Allergies  Viral Infections  Acid Reflux  Bacterial Infection Allergies, viruses and acid reflux are treated by controlling symptoms or eliminating the cause. An example might be a cough caused by taking certain blood pressure medications. You stop the cough by changing the medication. Another example might be a cough caused by acid reflux. Controlling the reflux helps control the cough.  USE OF BRONCHODILATOR ("RESCUE") INHALERS: There is a risk from using your bronchodilator too frequently.  The risk is that over-reliance on a medication which only relaxes the muscles surrounding the breathing tubes can reduce the effectiveness of medications prescribed to reduce swelling and congestion of the tubes themselves.  Although you feel brief relief from the bronchodilator inhaler, your asthma may actually be worsening with the tubes becoming more swollen and filled with mucus.  This can delay other crucial treatments, such  as oral steroid medications. If you need to use a bronchodilator inhaler daily, several times per day, you should discuss this with your provider.  There are probably better treatments that could be used to keep your asthma under control.     HOME CARE . Only take medications as instructed by your medical team. . Complete the entire course of an antibiotic. . Drink plenty of fluids and get plenty of rest. . Avoid close contacts especially the very young and the elderly . Cover your mouth if you cough or cough into your sleeve. . Always remember to wash your hands . A steam or ultrasonic humidifier can help congestion.   GET HELP RIGHT AWAY IF: . You develop worsening fever. . You become short of breath . You cough up blood. . Your symptoms persist after you have completed your treatment plan MAKE SURE YOU   Understand these instructions.  Will watch your condition.  Will get help right away if you are not doing well or get worse.  Your e-visit answers were reviewed by a board certified advanced clinical practitioner to complete your personal care plan.  Depending on the condition, your plan could have included both over the counter or prescription medications. If there is a problem please reply  once you have received a response from your provider. Your safety is important to us.  If you have drug allergies check your prescription carefully.    You can use MyChart to ask questions about today's visit, request a non-urgent call back, or ask for a work   or school excuse for 24 hours related to this e-Visit. If it has been greater than 24 hours you will need to follow up with your provider, or enter a new e-Visit to address those concerns. You will get an e-mail in the next two days asking about your experience.  I hope that your e-visit has been valuable and will speed your recovery. Thank you for using e-visits.   

## 2018-02-19 MED FILL — METOPROLOL SUCCINATE ER 50: 50 | 30 days supply | Qty: 30 | Fill #0

## 2018-02-19 MED FILL — AZITHROMYCIN 250 MG TABLET: 250 | 5 days supply | Qty: 6 | Fill #0

## 2018-03-07 ENCOUNTER — Ambulatory Visit: Payer: 59 | Admitting: Nurse Practitioner

## 2018-03-12 ENCOUNTER — Telehealth: Payer: No Typology Code available for payment source | Admitting: Family

## 2018-03-12 DIAGNOSIS — R300 Dysuria: Secondary | ICD-10-CM

## 2018-03-12 MED ORDER — NITROFURANTOIN MONOHYD MACRO 100 MG PO CAPS: 100.0000 mg | ORAL_CAPSULE | Freq: Two times a day (BID) | ORAL | 0 refills | Status: DC

## 2018-03-12 MED FILL — NITROFURANTOIN MONO-MCR 100: 100 | 5 days supply | Qty: 10 | Fill #0

## 2018-03-12 NOTE — Progress Notes (Signed)

## 2018-03-19 ENCOUNTER — Telehealth: Payer: No Typology Code available for payment source | Admitting: Nurse Practitioner

## 2018-03-19 DIAGNOSIS — J0100 Acute maxillary sinusitis, unspecified: Secondary | ICD-10-CM | POA: Diagnosis not present

## 2018-03-19 DIAGNOSIS — R590 Localized enlarged lymph nodes: Secondary | ICD-10-CM | POA: Diagnosis not present

## 2018-03-19 MED ORDER — DOXYCYCLINE HYCLATE 100 MG PO TABS: 100.0000 mg | ORAL_TABLET | Freq: Two times a day (BID) | ORAL | 0 refills | Status: DC

## 2018-03-19 NOTE — Progress Notes (Signed)

## 2018-04-16 ENCOUNTER — Telehealth: Payer: No Typology Code available for payment source | Admitting: Family

## 2018-04-16 DIAGNOSIS — J40 Bronchitis, not specified as acute or chronic: Secondary | ICD-10-CM | POA: Diagnosis not present

## 2018-04-16 DIAGNOSIS — R059 Cough, unspecified: Secondary | ICD-10-CM

## 2018-04-16 DIAGNOSIS — J302 Other seasonal allergic rhinitis: Secondary | ICD-10-CM | POA: Diagnosis not present

## 2018-04-16 DIAGNOSIS — R05 Cough: Secondary | ICD-10-CM

## 2018-04-16 MED ORDER — PREDNISONE 10 MG (21) PO TBPK: ORAL_TABLET | ORAL | 0 refills | Status: DC

## 2018-04-16 NOTE — Progress Notes (Signed)

## 2018-04-17 NOTE — Progress Notes (Signed)

## 2018-04-22 MED FILL — MONTELUKAST SOD 10 MG TAB: 10 | 30 days supply | Qty: 30 | Fill #0

## 2018-04-22 MED FILL — VENTOLIN HFA 90 MCG INHALER: 108 (90 BAS | 25 days supply | Qty: 18 | Fill #0

## 2018-04-28 ENCOUNTER — Telehealth: Payer: No Typology Code available for payment source | Admitting: Physician Assistant

## 2018-04-28 DIAGNOSIS — B373 Candidiasis of vulva and vagina: Secondary | ICD-10-CM | POA: Diagnosis not present

## 2018-04-28 DIAGNOSIS — B3731 Acute candidiasis of vulva and vagina: Secondary | ICD-10-CM

## 2018-04-28 MED ORDER — FLUCONAZOLE 150 MG PO TABS: 150.0000 mg | ORAL_TABLET | Freq: Once | ORAL | 0 refills | Status: AC

## 2018-04-28 NOTE — Progress Notes (Signed)

## 2018-06-26 ENCOUNTER — Telehealth: Payer: No Typology Code available for payment source | Admitting: Family

## 2018-06-26 DIAGNOSIS — G43009 Migraine without aura, not intractable, without status migrainosus: Secondary | ICD-10-CM

## 2018-06-26 NOTE — Progress Notes (Signed)
Based on what you shared with me it looks like you have a condition that should be evaluated in a face to face office visit.  NOTE: If you entered your credit card information for this eVisit, you will not be charged. You may see a "hold" on your card for the $30 but that hold will drop off and you will not have a charge processed.  If you are having a true medical emergency please call 911.  If you need an urgent face to face visit, [facility] has four urgent care centers for your convenience.  If you need care fast and have a high deductible or no insurance consider:   [email or web address] to reserve your spot online an avoid wait times  [facility] [address], [address] [place], [place] [address] 8 am to 8 pm Monday-Friday 10 am to 4 pm Saturday-Sunday *Across the street from [employer]  [facility]  [address] [place], [address] 8 am to 5 pm Monday-Friday * In the [facility]   The following sites will take your  insurance:  . [facility]  [phone] Get Driving Directions Find a Provider at this Location  [address] [place], [place] [address] . 10 am to 8 pm Monday-Friday . 12 pm to 8 pm Saturday-Sunday   . [facility]  [phone] Get Driving Directions Find a Provider at this Location  [address], [address] [place], [place] [address] . 8 am to 8 pm Monday-Friday . 9 am to 6 pm Saturday . 11 am to 6 pm Sunday   . [facility] [place]  [phone] Get Driving Directions  [address] [place],  [address] . 8 am to 8 pm Monday-Friday . 8 am to 4 pm Saturday-Sunday   Your e-visit answers were reviewed by a board certified advanced clinical practitioner to complete your personal care plan.  Thank you for using e-Visits. 

## 2018-08-09 ENCOUNTER — Ambulatory Visit (INDEPENDENT_AMBULATORY_CARE_PROVIDER_SITE_OTHER): Payer: Self-pay | Admitting: Nurse Practitioner

## 2018-08-09 VITALS — BP 122/86 | HR 104 | Temp 99.0°F | Resp 19 | Wt 215.0 lb

## 2018-08-09 DIAGNOSIS — J209 Acute bronchitis, unspecified: Secondary | ICD-10-CM

## 2018-08-09 DIAGNOSIS — J0140 Acute pansinusitis, unspecified: Secondary | ICD-10-CM

## 2018-08-09 MED ORDER — PSEUDOEPH-BROMPHEN-DM 30-2-10 MG/5ML PO SYRP: 5.0000 mL | ORAL_SOLUTION | Freq: Four times a day (QID) | ORAL | 0 refills | Status: AC | PRN

## 2018-08-09 MED ORDER — AZITHROMYCIN 250 MG PO TABS: ORAL_TABLET | ORAL | 0 refills | Status: DC

## 2018-08-09 MED ORDER — PREDNISONE 10 MG (21) PO TBPK: ORAL_TABLET | ORAL | 0 refills | Status: DC

## 2018-08-09 MED ORDER — MONTELUKAST SODIUM 10 MG PO TABS: 10.0000 mg | ORAL_TABLET | Freq: Every day | ORAL | 0 refills | Status: DC

## 2018-08-09 NOTE — Patient Instructions (Addendum)
Sinusitis, Adult  -Take medications as prescribed. -Ibuprofen or Tylenol for pain, fever or general discomfort.  -Use humidifier or vaporizer when at home and during sleep. -Sleep propped or elevated on at least 2 pillows during sleep. -Increase fluids. -Follow-up as needed.      Sinusitis is soreness and inflammation of your sinuses. Sinuses are hollow spaces in the bones around your face. Your sinuses are located:  Around your eyes.  In the middle of your forehead.  Behind your nose.  In your cheekbones.  Your sinuses and nasal passages are lined with a stringy fluid (mucus). Mucus normally drains out of your sinuses. When your nasal tissues become inflamed or swollen, the mucus can become trapped or blocked so air cannot flow through your sinuses. This allows bacteria, viruses, and funguses to grow, which leads to infection. Sinusitis can develop quickly and last for 7?10 days (acute) or for more than 12 weeks (chronic). Sinusitis often develops after a cold. What are the causes? This condition is caused by anything that creates swelling in the sinuses or stops mucus from draining, including:  Allergies.  Asthma.  Bacterial or viral infection.  Abnormally shaped bones between the nasal passages.  Nasal growths that contain mucus (nasal polyps).  Narrow sinus openings.  Pollutants, such as chemicals or irritants in the air.  A foreign object stuck in the nose.  A fungal infection. This is rare.  What increases the risk? The following factors may make you more likely to develop this condition:  Having allergies or asthma.  Having had a recent cold or respiratory tract infection.  Having structural deformities or blockages in your nose or sinuses.  Having a weak immune system.  Doing a lot of swimming or diving.  Overusing nasal sprays.  Smoking.  What are the signs or symptoms? The main symptoms of this condition are pain and a feeling of pressure  around the affected sinuses. Other symptoms include:  Upper toothache.  Earache.  Headache.  Bad breath.  Decreased sense of smell and taste.  A cough that may get worse at night.  Fatigue.  Fever.  Thick drainage from your nose. The drainage is often green and it may contain pus (purulent).  Stuffy nose or congestion.  Postnasal drip. This is when extra mucus collects in the throat or back of the nose.  Swelling and warmth over the affected sinuses.  Sore throat.  Sensitivity to light.  How is this diagnosed? This condition is diagnosed based on symptoms, a medical history, and a physical exam. To find out if your condition is acute or chronic, your health care provider may:  Look in your nose for signs of nasal polyps.  Tap over the affected sinus to check for signs of infection.  View the inside of your sinuses using an imaging device that has a light attached (endoscope).  If your health care provider suspects that you have chronic sinusitis, you may also:  Be tested for allergies.  Have a sample of mucus taken from your nose (nasal culture) and checked for bacteria.  Have a mucus sample examined to see if your sinusitis is related to an allergy.  If your sinusitis does not respond to treatment and it lasts longer than 8 weeks, you may have an MRI or CT scan to check your sinuses. These scans also help to determine how severe your infection is. In rare cases, a bone biopsy may be done to rule out more serious types of fungal sinus disease.  How is this treated? Treatment for sinusitis depends on the cause and whether your condition is chronic or acute. If a virus is causing your sinusitis, your symptoms will go away on their own within 10 days. You may be given medicines to relieve your symptoms, including:  Topical nasal decongestants. They shrink swollen nasal passages and let mucus drain from your sinuses.  Antihistamines. These drugs block inflammation  that is triggered by allergies. This can help to ease swelling in your nose and sinuses.  Topical nasal corticosteroids. These are nasal sprays that ease inflammation and swelling in your nose and sinuses.  Nasal saline washes. These rinses can help to get rid of thick mucus in your nose.  If your condition is caused by bacteria, you will be given an antibiotic medicine. If your condition is caused by a fungus, you will be given an antifungal medicine. Surgery may be needed to correct underlying conditions, such as narrow nasal passages. Surgery may also be needed to remove polyps. Follow these instructions at home: Medicines  Take, use, or apply over-the-counter and prescription medicines only as told by your health care provider. These may include nasal sprays.  If you were prescribed an antibiotic medicine, take it as told by your health care provider. Do not stop taking the antibiotic even if you start to feel better. Hydrate and Humidify  Drink enough water to keep your urine clear or pale yellow. Staying hydrated will help to thin your mucus.  Use a cool mist humidifier to keep the humidity level in your home above 50%.  Inhale steam for 10-15 minutes, 3-4 times a day or as told by your health care provider. You can do this in the bathroom while a hot shower is running.  Limit your exposure to cool or dry air. Rest  Rest as much as possible.  Sleep with your head raised (elevated).  Make sure to get enough sleep each night. General instructions  Apply a warm, moist washcloth to your face 3-4 times a day or as told by your health care provider. This will help with discomfort.  Wash your hands often with soap and water to reduce your exposure to viruses and other germs. If soap and water are not available, use hand sanitizer.  Do not smoke. Avoid being around people who are smoking (secondhand smoke).  Keep all follow-up visits as told by your health care provider. This is  important. Contact a health care provider if:  You have a fever.  Your symptoms get worse.  Your symptoms do not improve within 10 days. Get help right away if:  You have a severe headache.  You have persistent vomiting.  You have pain or swelling around your face or eyes.  You have vision problems.  You develop confusion.  Your neck is stiff.  You have trouble breathing. This information is not intended to replace advice given to you by your health care provider. Make sure you discuss any questions you have with your health care provider. Document Released: 12/11/2005 Document Revised: 08/06/2016 Document Reviewed: 10/06/2015 Elsevier Interactive Patient Education  2018 [employer].  Acute Bronchitis, Adult Acute bronchitis is sudden (acute) swelling of the air tubes (bronchi) in the lungs. Acute bronchitis causes these tubes to fill with mucus, which can make it hard to breathe. It can also cause coughing or wheezing. In adults, acute bronchitis usually goes away within 2 weeks. A cough caused by bronchitis may last up to 3 weeks. Smoking, allergies, and asthma  can make the condition worse. Repeated episodes of bronchitis may cause further lung problems, such as chronic obstructive pulmonary disease (COPD). What are the causes? This condition can be caused by germs and by substances that irritate the lungs, including:  Cold and flu viruses. This condition is most often caused by the same virus that causes a cold.  Bacteria.  Exposure to tobacco smoke, dust, fumes, and air pollution.  What increases the risk? This condition is more likely to develop in people who:  Have close contact with someone with acute bronchitis.  Are exposed to lung irritants, such as tobacco smoke, dust, fumes, and vapors.  Have a weak immune system.  Have a respiratory condition such as asthma.  What are the signs or symptoms? Symptoms of this condition include:  A cough.  Coughing  up clear, yellow, or green mucus.  Wheezing.  Chest congestion.  Shortness of breath.  A fever.  Body aches.  Chills.  A sore throat.  How is this diagnosed? This condition is usually diagnosed with a physical exam. During the exam, your health care provider may order tests, such as chest X-rays, to rule out other conditions. He or she may also:  Test a sample of your mucus for bacterial infection.  Check the level of oxygen in your blood. This is done to check for pneumonia.  Do a chest X-ray or lung function testing to rule out pneumonia and other conditions.  Perform blood tests.  Your health care provider will also ask about your symptoms and medical history. How is this treated? Most cases of acute bronchitis clear up over time without treatment. Your health care provider may recommend:  Drinking more fluids. Drinking more makes your mucus thinner, which may make it easier to breathe.  Taking a medicine for a fever or cough.  Taking an antibiotic medicine.  Using an inhaler to help improve shortness of breath and to control a cough.  Using a cool mist vaporizer or humidifier to make it easier to breathe.  Follow these instructions at home: Medicines  Take over-the-counter and prescription medicines only as told by your health care provider.  If you were prescribed an antibiotic, take it as told by your health care provider. Do not stop taking the antibiotic even if you start to feel better. General instructions  Get plenty of rest.  Drink enough fluids to keep your urine clear or pale yellow.  Avoid smoking and secondhand smoke. Exposure to cigarette smoke or irritating chemicals will make bronchitis worse. If you smoke and you need help quitting, ask your health care provider. Quitting smoking will help your lungs heal faster.  Use an inhaler, cool mist vaporizer, or humidifier as told by your health care provider.  Keep all follow-up visits as told by  your health care provider. This is important. How is this prevented? To lower your risk of getting this condition again:  Wash your hands often with soap and water. If soap and water are not available, use hand sanitizer.  Avoid contact with people who have cold symptoms.  Try not to touch your hands to your mouth, nose, or eyes.  Make sure to get the flu shot every year.  Contact a health care provider if:  Your symptoms do not improve in 2 weeks of treatment. Get help right away if:  You cough up blood.  You have chest pain.  You have severe shortness of breath.  You become dehydrated.  You faint or keep feeling  like you are going to faint.  You keep vomiting.  You have a severe headache.  Your fever or chills gets worse. This information is not intended to replace advice given to you by your health care provider. Make sure you discuss any questions you have with your health care provider. Document Released: 01/18/2005 Document Revised: 07/05/2016 Document Reviewed: 05/31/2016 Elsevier Interactive Patient Education  [employer]2018 [employer]

## 2018-08-09 NOTE — Progress Notes (Signed)
Subjective:  Sophia Oneill is a 32 y.o. female who presents for evaluation of possible sinusitis.  Symptoms include headache described as dull, aching, nasal congestion and sinus pressure.  Onset of symptoms was 7 days ago, and has been gradually worsening since that time.  Treatment to date:  Claritin and Albuterol inhaler.  High risk factors for influenza complications:  none.    The following portions of the patient's history were reviewed and updated as appropriate:  allergies, current medications and past medical history.  Constitutional: positive for fatigue, negative for anorexia, chills, fevers and malaise Eyes: negative Ears, nose, mouth, throat, and face: positive for nasal congestion and sore throat, negative for ear drainage, earaches and hoarseness Respiratory: positive for cough, sputum and wheezing, negative for asthma, chronic bronchitis, dyspnea on exertion, pneumonia and chest pain with coughing Cardiovascular: negative Gastrointestinal: negative Neurological: positive for headaches, negative for coordination problems, dizziness, paresthesia, seizures, speech problems, tremors, vertigo and weakness Allergic/Immunologic: positive for hay fever   Objective:  BP 122/86 (BP Location: Right Arm, Patient Position: Sitting, Cuff Size: Normal)   Pulse (!) 104   Temp 99 F (37.2 C) (Oral)   Resp 19   Wt 215 lb (97.5 kg)   SpO2 99%   BMI 38.09 kg/m  General appearance: alert, cooperative, fatigued and no distress Head: Normocephalic, without obvious abnormality, atraumatic Eyes: conjunctivae/corneas clear. PERRL, EOM's intact. Fundi benign. Ears: abnormal TM right ear - mucoid middle ear fluid and abnormal TM left ear - mucoid middle ear fluid Nose: no discharge, turbinates swollen, inflamed, moderate maxillary sinus tenderness bilateral, moderate frontal sinus tenderness bilateral Throat: lips, mucosa, and tongue normal; teeth and gums normal Lungs: clear to auscultation  bilaterally Heart: regular rate and rhythm, S1, S2 normal, no murmur, click, rub or gallop Abdomen: soft, non-tender; bowel sounds normal; no masses,  no organomegaly Pulses: 2+ and symmetric Skin: Skin color, texture, turgor normal. No rashes or lesions Lymph nodes: cervical and submandibular nodes normal Neurologic: Grossly normal    Assessment:  Acute Sinusitis and Acute Bronchitis    Plan:  Exam findings, diagnosis etiology and medication use and indications reviewed with patient. Follow- Up and discharge instructions provided. No emergent/urgent issues found on exam.  Patient verbalized understanding of information provided and agrees with plan of care (POC), all questions answered.  1. Acute pansinusitis, recurrence not specified  - azithromycin (ZITHROMAX) 250 MG tablet; Take as directed.  Dispense: 6 tablet; Refill: 0 - predniSONE (STERAPRED UNI-PAK 21 TAB) 10 MG (21) TBPK tablet; Take as directed.  Dispense: 21 tablet; Refill: 0 - montelukast (SINGULAIR) 10 MG tablet; Take 1 tablet (10 mg total) by mouth at bedtime.  Dispense: 30 tablet; Refill: 0 - brompheniramine-pseudoephedrine-DM 30-2-10 MG/5ML syrup; Take 5 mLs by mouth 4 (four) times daily as needed for up to 7 days.  Dispense: 150 mL; Refill: 0 -Take medications as prescribed. -Ibuprofen or Tylenol for pain, fever or general discomfort.  -Use humidifier or vaporizer when at home and during sleep. -Sleep propped or elevated on at least 2 pillows during sleep. -Increase fluids. -Follow-up as needed.  2. Acute bronchitis, unspecified organism  - azithromycin (ZITHROMAX) 250 MG tablet; Take as directed.  Dispense: 6 tablet; Refill: 0 - predniSONE (STERAPRED UNI-PAK 21 TAB) 10 MG (21) TBPK tablet; Take as directed.  Dispense: 21 tablet; Refill: 0 - brompheniramine-pseudoephedrine-DM 30-2-10 MG/5ML syrup; Take 5 mLs by mouth 4 (four) times daily as needed for up to 7 days.  Dispense: 150 mL; Refill: 0 -Take medications  as prescribed. -Ibuprofen or Tylenol for pain, fever or general discomfort.  -Use humidifier or vaporizer when at home and during sleep. -Sleep propped or elevated on at least 2 pillows during sleep. -Increase fluids. -Follow-up as needed.

## 2018-08-25 ENCOUNTER — Telehealth: Payer: No Typology Code available for payment source | Admitting: Nurse Practitioner

## 2018-08-25 DIAGNOSIS — B9789 Other viral agents as the cause of diseases classified elsewhere: Secondary | ICD-10-CM | POA: Diagnosis not present

## 2018-08-25 DIAGNOSIS — J019 Acute sinusitis, unspecified: Secondary | ICD-10-CM

## 2018-08-25 MED ORDER — FLUTICASONE PROPIONATE 50 MCG/ACT NA SUSP: 2.0000 | Freq: Every day | NASAL | 6 refills | Status: DC

## 2018-08-25 NOTE — Progress Notes (Signed)

## 2018-09-25 ENCOUNTER — Telehealth: Payer: No Typology Code available for payment source | Admitting: Nurse Practitioner

## 2018-09-25 DIAGNOSIS — N3000 Acute cystitis without hematuria: Secondary | ICD-10-CM

## 2018-09-25 MED ORDER — NITROFURANTOIN MONOHYD MACRO 100 MG PO CAPS: 100.0000 mg | ORAL_CAPSULE | Freq: Two times a day (BID) | ORAL | 0 refills | Status: DC

## 2018-09-25 MED FILL — NITROFURANTOIN MONO-MCR 100: 100 | 7 days supply | Qty: 14 | Fill #0

## 2018-09-25 NOTE — Progress Notes (Signed)

## 2018-10-22 MED FILL — AZELASTINE HCL 0.05% DROPS: 0.05 | 30 days supply | Qty: 6 | Fill #0

## 2018-10-22 MED FILL — BREO ELLIPTA 100-25 MCG INH: 100-25 | 30 days supply | Qty: 60 | Fill #0

## 2018-10-22 MED FILL — LEVOCETIRIZINE 5 MG TABLET: 5 | 30 days supply | Qty: 30 | Fill #0

## 2018-10-22 MED FILL — AZELASTINE HCL 137 MCG/SPRA: 137 | 50 days supply | Qty: 30 | Fill #0

## 2018-10-28 ENCOUNTER — Telehealth: Payer: No Typology Code available for payment source | Admitting: Family

## 2018-10-28 DIAGNOSIS — B9789 Other viral agents as the cause of diseases classified elsewhere: Secondary | ICD-10-CM | POA: Diagnosis not present

## 2018-10-28 DIAGNOSIS — J069 Acute upper respiratory infection, unspecified: Secondary | ICD-10-CM | POA: Diagnosis not present

## 2018-10-28 MED ORDER — FLUTICASONE PROPIONATE 50 MCG/ACT NA SUSP: 2.0000 | Freq: Every day | NASAL | 6 refills | Status: DC

## 2018-10-28 MED ORDER — BENZONATATE 100 MG PO CAPS: 100.0000 mg | ORAL_CAPSULE | Freq: Three times a day (TID) | ORAL | 0 refills | Status: DC | PRN

## 2018-10-28 NOTE — Progress Notes (Signed)

## 2018-12-22 ENCOUNTER — Ambulatory Visit: Payer: Self-pay | Admitting: Family Medicine

## 2018-12-27 LAB — OB RESULTS CONSOLE GC/CHLAMYDIA
Chlamydia: NEGATIVE
Chlamydia: NEGATIVE
Gonorrhea: NEGATIVE
Gonorrhea: NEGATIVE

## 2018-12-27 LAB — OB RESULTS CONSOLE ABO/RH: RH Type: NEGATIVE

## 2018-12-27 LAB — OB RESULTS CONSOLE HEPATITIS B SURFACE ANTIGEN
Hepatitis B Surface Ag: NEGATIVE
Hepatitis B Surface Ag: NEGATIVE

## 2018-12-27 LAB — OB RESULTS CONSOLE RUBELLA ANTIBODY, IGM
Rubella: IMMUNE
Rubella: IMMUNE
Rubella: IMMUNE

## 2018-12-27 LAB — OB RESULTS CONSOLE ANTIBODY SCREEN: Antibody Screen: NEGATIVE

## 2018-12-27 LAB — OB RESULTS CONSOLE HIV ANTIBODY (ROUTINE TESTING)
HIV: NONREACTIVE
HIV: NONREACTIVE

## 2018-12-27 LAB — OB RESULTS CONSOLE RPR
RPR: NONREACTIVE
RPR: NONREACTIVE

## 2018-12-31 LAB — OB RESULTS CONSOLE GC/CHLAMYDIA
Chlamydia: NEGATIVE
Gonorrhea: NEGATIVE

## 2018-12-31 LAB — OB RESULTS CONSOLE HEPATITIS B SURFACE ANTIGEN: Hepatitis B Surface Ag: NEGATIVE

## 2018-12-31 LAB — OB RESULTS CONSOLE RPR: RPR: NONREACTIVE

## 2018-12-31 LAB — OB RESULTS CONSOLE RUBELLA ANTIBODY, IGM: Rubella: IMMUNE

## 2018-12-31 LAB — OB RESULTS CONSOLE HIV ANTIBODY (ROUTINE TESTING): HIV: NONREACTIVE

## 2019-01-20 ENCOUNTER — Telehealth: Payer: No Typology Code available for payment source | Admitting: Family

## 2019-01-20 DIAGNOSIS — J069 Acute upper respiratory infection, unspecified: Secondary | ICD-10-CM

## 2019-01-20 DIAGNOSIS — J029 Acute pharyngitis, unspecified: Secondary | ICD-10-CM

## 2019-01-20 MED ORDER — FLUTICASONE PROPIONATE 50 MCG/ACT NA SUSP: 2.0000 | Freq: Every day | NASAL | 6 refills | Status: DC

## 2019-01-20 NOTE — Progress Notes (Signed)
We are sorry you are not feeling well.  Here is how we plan to help!  Based on what you have shared with me, it looks like you may have a viral upper respiratory infection or a "common cold".  Colds are caused by a large number of viruses; however, rhinovirus is the most common cause.   If your symptoms do not improve or worsen, you will need to follow up face to face to be evaluated. Especially since you are pregnant, we do not want to give you medication unless needed.   Symptoms of the common cold vary from person to person, with common symptoms including sore throat, cough, and malaise.  A low-grade fever of 100.4 may present, but is often uncommon.  Symptoms vary however, and are closely related to a person's age or underlying illnesses.  The most common symptoms associated with the common cold are nasal discharge or congestion, cough, sneezing, headache and pressure in the ears and face.  Cold symptoms usually persist for about 3 to 10 days, but can last up to 2 weeks.  It is important to know that colds do not cause serious illness or complications in most cases.    The common cold is transmitted from person to person, with the most common method of transmission being a person's hands.  The virus is able to live on the skin and can infect other persons for up to 2 hours after direct contact.  Also, colds are transmitted when someone coughs or sneezes; thus, it is important to cover the mouth to reduce this risk.  To keep the spread of the common cold at bay, good hand hygiene is very important.  This is an infection that is most likely caused by a virus. There are no specific treatments for the common cold other than to help you with the symptoms until the infection runs its course.    For nasal congestion, you may use an oral decongestants such as Mucinex D or if you have glaucoma or high blood pressure use plain Mucinex.  Saline nasal spray or nasal drops can help and can safely be used as often  as needed for congestion.  For your congestion, I have prescribed Fluticasone nasal spray one spray in each nostril twice a day.   If you do not have a history of heart disease, hypertension, diabetes or thyroid disease, prostate/bladder issues or glaucoma, you may also use Sudafed to treat nasal congestion.  It is highly recommended that you consult with a pharmacist or your primary care physician to ensure this medication is safe for you to take.     If you have a cough, you may use cough suppressants such as Delsym and Robitussin.  If you have glaucoma or high blood pressure, you can also use Coricidin HBP.     If you have a sore or scratchy throat, use a saltwater gargle-  to  teaspoon of salt dissolved in a 4-ounce to 8-ounce glass of warm water.  Gargle the solution for approximately 15-30 seconds and then spit.  It is important not to swallow the solution.  You can also use throat lozenges/cough drops and Chloraseptic spray to help with throat pain or discomfort.  Warm or cold liquids can also be helpful in relieving throat pain.  For headache, pain or general discomfort, you can use Ibuprofen or Tylenol as directed.   Some authorities believe that zinc sprays or the use of Echinacea may shorten the course of your symptoms.  HOME CARE . Only take medications as instructed by your medical team. . Be sure to drink plenty of fluids. Water is fine as well as fruit juices, sodas and electrolyte beverages. You may want to stay away from caffeine or alcohol. If you are nauseated, try taking small sips of liquids. How do you know if you are getting enough fluid? Your urine should be a pale yellow or almost colorless. . Get rest. . Taking a steamy shower or using a humidifier may help nasal congestion and ease sore throat pain. You can place a towel over your head and breathe in the steam from hot water coming from a faucet. . Using a saline nasal spray works much the same way. . Cough drops,  hard candies and sore throat lozenges may ease your cough. . Avoid close contacts especially the very young and the elderly . Cover your mouth if you cough or sneeze . Always remember to wash your hands.   GET HELP RIGHT AWAY IF: . You develop worsening fever. . If your symptoms do not improve within 10 days . You develop yellow or green discharge from your nose over 3 days. . You have coughing fits . You develop a severe head ache or visual changes. . You develop shortness of breath, difficulty breathing or start having chest pain . Your symptoms persist after you have completed your treatment plan  MAKE SURE YOU   Understand these instructions.  Will watch your condition.  Will get help right away if you are not doing well or get worse.  Your e-visit answers were reviewed by a board certified advanced clinical practitioner to complete your personal care plan. Depending upon the condition, your plan could have included both over the counter or prescription medications. Please review your pharmacy choice. If there is a problem, you may call our nursing hot line at and have the prescription routed to another pharmacy. Your safety is important to [place]. If you have drug allergies check your prescription carefully.   You can use MyChart to ask questions about today's visit, request a non-urgent call back, or ask for a work or school excuse for 24 hours related to this e-Visit. If it has been greater than 24 hours you will need to follow up with your provider, or enter a new e-Visit to address those concerns. You will get an e-mail in the next two days asking about your experience.  I hope that your e-visit has been valuable and will speed your recovery. Thank you for using e-visits.

## 2019-01-29 MED FILL — TERCONAZOLE 0.4% VAG CREAM: 0.4 | 7 days supply | Qty: 45 | Fill #0

## 2019-01-29 MED FILL — TRIAMCINOLONE 0.1% OINTMEN: 0.1 | 10 days supply | Qty: 30 | Fill #0

## 2019-01-29 MED FILL — NYSTATIN 100,000 UNITS/GM O: 100000 | 10 days supply | Qty: 30 | Fill #0

## 2019-03-24 ENCOUNTER — Telehealth: Payer: No Typology Code available for payment source | Admitting: Family

## 2019-03-24 DIAGNOSIS — H60501 Unspecified acute noninfective otitis externa, right ear: Secondary | ICD-10-CM

## 2019-03-24 MED ORDER — CIPROFLOXACIN-HYDROCORTISONE 0.2-1 % OT SUSP: 3.0000 [drp] | Freq: Two times a day (BID) | OTIC | 0 refills | Status: DC

## 2019-03-24 NOTE — Progress Notes (Signed)
E Visit for Swimmer's Ear  We are sorry that you are not feeling well. Here is how we plan to help!  Based on what you have shared with me it looks like you have swimmers ear. Swimmer's ear is a redness or swelling, irritation, or infection of your outer ear canal.  These symptoms usually occur within a few days of swimming.  Your ear canal is a tube that goes from the opening of the ear to the eardrum.  When water stays in your ear canal, germs can grow.  This is a painful condition that often happens to children and swimmers of all ages.  It is not contagious and oral antibiotics are not required to treat uncomplicated swimmer's ear.  The usual symptoms include: Itching inside the ear, Redness or a sense of swelling in the ear, Pain when the ear is tugged on when pressure is placed on the ear, Pus draining from the infected ear. and I have prescribed: Ciprofloxin 0.2% and hydrocortisone 1% otic suspension 3 drops in affected ears twice daily for 7 days  In certain cases swimmer's ear may progress to a more serious bacterial infection of the middle or inner ear.  If you have a fever 102 and up and significantly worsening symptoms, this could indicate a more serious infection moving to the middle/inner and needs face to face evaluation in an office by a provider.  Your symptoms should improve over the next 3 days and should resolve in about 7 days.  Approximately 5 minutes was spent documenting and reviewing patient's chart.    HOME CARE:   Wash your hands frequently.  Do not place the tip of the bottle on your ear or touch it with your fingers.  You can take Acetominophen 650 mg every 4-6 hours as needed for pain.  If pain is severe or moderate, you can apply a heating pad (set on low) or hot water bottle (wrapped in a towel) to outer ear for 20 minutes.  This will also increase drainage.  Avoid ear plugs  Do not use Q-tips  After showers, help the water run out by tilting your head to one  side.  GET HELP RIGHT AWAY IF:   Fever is over 102.2 degrees.  You develop progressive ear pain or hearing loss.  Ear symptoms persist longer than 3 days after treatment.  MAKE SURE YOU:   Understand these instructions.  Will watch your condition.  Will get help right away if you are not doing well or get worse.  TO PREVENT SWIMMER'S EAR:  Use a bathing cap or custom fitted swim molds to keep your ears dry.  Towel off after swimming to dry your ears.  Tilt your head or pull your earlobes to allow the water to escape your ear canal.  If there is still water in your ears, consider using a hairdryer on the lowest setting.  Thank you for choosing an e-visit. Your e-visit answers were reviewed by a board certified advanced clinical practitioner to complete your personal care plan. Depending upon the condition, your plan could have included both over the counter or prescription medications. Please review your pharmacy choice. Be sure that the pharmacy you have chosen is open so that you can pick up your prescription now.  If there is a problem you may message your provider in MyChart to have the prescription routed to another pharmacy. Your safety is important to [place]. If you have drug allergies check your prescription carefully.  For the  next 24 hours, you can use MyChart to ask questions about today's visit, request a non-urgent call back, or ask for a work or school excuse from your e-visit provider. You will get an email in the next two days asking about your experience. I hope that your e-visit has been valuable and will speed your recovery.       

## 2019-03-25 MED ORDER — OFLOXACIN 0.3 % OT SOLN: 5.0000 [drp] | Freq: Every day | OTIC | 0 refills | Status: DC

## 2019-03-25 NOTE — Progress Notes (Signed)
Prescription changed to floxin otic  Orders Placed This Encounter         ofloxacin (FLOXIN OTIC) 0.3 % OTIC solution         Sig: Place 5 drops into both ears daily.         Dispense:  10 mL         Refill:  0         Order Specific Question: Supervising Provider         Answer: Eber Hong [[id number]]

## 2019-03-25 NOTE — Addendum Note (Signed)
Addended by: Bennie Pierini on: 03/25/2019 08:01 PM   Modules accepted: Orders

## 2019-04-25 DIAGNOSIS — O24419 Gestational diabetes mellitus in pregnancy, unspecified control: Secondary | ICD-10-CM

## 2019-04-25 HISTORY — DX: Gestational diabetes mellitus in pregnancy, unspecified control: O24.419

## 2019-04-29 ENCOUNTER — Telehealth: Payer: No Typology Code available for payment source | Admitting: Physician Assistant

## 2019-04-29 DIAGNOSIS — N390 Urinary tract infection, site not specified: Secondary | ICD-10-CM

## 2019-04-29 NOTE — Progress Notes (Signed)
Based on what you shared with me, I feel your condition warrants further evaluation and I recommend that you be seen for a face to face office visit.     NOTE: If you entered your credit card information for this eVisit, you will not be charged. You may see a "hold" on your card for the $35 but that hold will drop off and you will not have a charge processed.  If you are having a true medical emergency please call 911.  If you need an urgent face to face visit, [place] has four urgent care centers for your convenience.    PLEASE NOTE: THE [facility] LOCATIONS AND URGENT CARE CLINICS DO NOT HAVE THE TESTING FOR CORONAVIRUS COVID19 AVAILABLE.  IF YOU FEEL YOU NEED THIS TEST YOU MUST GO TO A TRIAGE LOCATION AT ONE OF THE HOSPITAL EMERGENCY DEPARTMENTS ?  [email or web address] to reserve your spot online an avoid wait times  [facility] [address], [address] [place], [place] [address] Modified hours of operation: Monday-Friday, 12 PM to 6 PM  Saturday & Sunday 10 AM to 4 PM *Across the street from [employer]  [facility] [employer] (New Address!) [address], [address] [place], [place] [address] *Just off [address], across the road from [place]* Modified hours of operation: Monday-Friday, 12 PM to 6 PM  Closed Saturday & Sunday  [facility]'s modified hours of operation will be in effect from May 1 until May 31   The following sites will take your insurance:  [facility]  [phone] Get Driving Directions Find a Provider at this Location  [address] [place], [place] [address] 10 am to 8 pm Monday-Friday 12 pm to 8 pm [facility] at [facility]  [phone] Get Driving Directions Find a Provider at this Location  [address] [place], [place] [address] 8 am to 8 pm Monday-Friday 9 am to 6 pm Saturday 11 am to 6 pm Sunday   [facility]  at [facility]  [phone] Get Driving Directions  [address] [place], [place] [address] 8 am to 8 pm Monday-Friday 8 am to 4 pm Saturday-Sunday   Your e-visit answers were reviewed by a board certified advanced clinical practitioner to complete your personal care plan.  Thank you for using e-Visits.  ===View-only below this line===   ----- Message -----    From: Sophia Oneill    Sent: 04/29/2019 10:17 AM EDT      To: E-Visit Mailing List Subject: E-Visit Submission: Urinary Problems  E-Visit Submission: Urinary Problems --------------------------------  Question: Which of the following are you experiencing? Answer:   Difficulty passing urine            Change in urine appearance or smell  Question: Are you able to pass urine? Answer:   Yes, I can pass urine with difficulty  Question: How long have you had pain or difficulty passing urine? Answer:   More  than two days but less than one week  Question: Do you have a fever? Answer:   No, I do not have a fever  Question: Do you have any of the following? Answer:   I have back pain with this illness  Question: Do you have an exaggerated sensation of the need to pass urine? Answer:   Yes, the sensation is exaggerated  Question: Do you have the urge to urinate more of less frequently than normal? Answer:  More frequently  Question: What does your urine look like? Answer:   It is clear  Question: Do you have any of the following? Answer:   None of the above  Question: Do you have any of the following? Answer:   No discharge  Question: Do you have any sores on your genitals? Answer:   No  Question: Do you have any history of kidney dysfunction or kidney problems? Answer:   No  Question: Within the past 3 months, have you had any surgery on your kidneys or bladder, or have you had a tube inserted to collect your urine? Answer:   No, I have never had either  Question: Have you had similar  symptoms in the past? Answer:   Yes, I have had similar symptoms before  Question: If you had similar symptoms in the past, did any of the following work? Answer:   Pills for urine infection            Pills for yeast infection  Question: Please list any additional comments  Answer:     Question: Please list your medication allergies that you may have ? (If 'none' , please list as 'none') Answer:   Penicillin/Amoxicillin  Question: Are you pregnant? Answer:   I am pregnant  Question: Are you breastfeeding? Answer:   No

## 2019-05-08 ENCOUNTER — Ambulatory Visit: Payer: No Typology Code available for payment source | Admitting: Dietician

## 2019-05-08 ENCOUNTER — Encounter: Payer: Self-pay | Admitting: Dietician

## 2019-05-08 DIAGNOSIS — O24410 Gestational diabetes mellitus in pregnancy, diet controlled: Secondary | ICD-10-CM

## 2019-05-08 NOTE — Progress Notes (Signed)
This visit was completed via telephone due to the COVID-19 pandemic.   I spoke with Sophia Oneill and verified that I was speaking with the correct person with two patient identifiers (full name and date of birth).   I discussed the limitations related to this kind of visit and the patient is willing to proceed. Time:  2:30-3:40  Patient lives with her husband and 31 yo son.  She is [redacted] weeks gestation.  210 lbs currently and 204 lbs prepregnancy.  She had increased morning sickness and had not been able to gain weight.  She is being seen today for GDM.  She states that her MD is concerned that she is not eating properly.   She has had no previous GDM. Her sister and father have type 2 diabetes. She is currently out of work.  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 She has a history of Prediabetes and PCOS.  Diet hx: Breakfast:  Eggs, oatmeal, berries OR Ensure "if unable to stomach food" Snack:  Cashews, strawberries, dried apricots, yogurt and granola or chese and crackers Lunch:  Yogurt, crackers, cheese, [place] occasionally Snack:  Similar to am Dinner:  Grilled chicken, occasional pasta, vegetables (at times "has trouble stomaching them) or ground [place] in [employer] AND Ensure Snack:  Similar to am Beverages:  Water with lemon, Gatorade (regular), Ensure (original or plus) She has taken the Ensure 2 weeks ago.  Patient was educated on GDM. The following learning objectives were met by the patient during this course:   States the definition of Gestational Diabetes  States why dietary management is important in controlling blood glucose  Describes the effects each nutrient has on blood glucose levels  Demonstrates ability to create a balanced meal plan  Demonstrates carbohydrate counting   States when to check blood glucose levels  Demonstrates proper blood glucose monitoring techniques  States the effect of stress and exercise on blood glucose  levels  States the importance of limiting caffeine and abstaining from alcohol and smoking  She has been testing her BG 4 times daily for the past week.   She does have insomnia and snacks through the night. Fasting readings are always WNL. Post meal readings have been 131-163. She was taking Metformin XR prior to pregnancy due to a history of prediabetes and PCOS.  Patient instructed to monitor glucose levels: FBS: 60 - <90 1 hour: <140 2 hour: <120  *Patient received handouts:  Nutrition Diabetes and Pregnancy  [facility] and [place]  Patient will be seen for follow-up as needed.

## 2019-06-07 ENCOUNTER — Other Ambulatory Visit: Payer: Self-pay

## 2019-06-07 ENCOUNTER — Encounter (HOSPITAL_COMMUNITY): Payer: Self-pay | Admitting: *Deleted

## 2019-06-07 ENCOUNTER — Inpatient Hospital Stay (HOSPITAL_COMMUNITY)
Admission: AD | Admit: 2019-06-07 | Discharge: 2019-06-07 | Disposition: A | Discharge status: Home / Self Care | Payer: No Typology Code available for payment source | Source: Ambulatory Visit | Attending: Obstetrics & Gynecology | Admitting: Obstetrics & Gynecology

## 2019-06-07 DIAGNOSIS — Z3A35 35 weeks gestation of pregnancy: Secondary | ICD-10-CM | POA: Diagnosis not present

## 2019-06-07 DIAGNOSIS — Z880 Allergy status to penicillin: Secondary | ICD-10-CM | POA: Diagnosis not present

## 2019-06-07 DIAGNOSIS — N949 Unspecified condition associated with female genital organs and menstrual cycle: Secondary | ICD-10-CM

## 2019-06-07 DIAGNOSIS — R102 Pelvic and perineal pain: Secondary | ICD-10-CM | POA: Insufficient documentation

## 2019-06-07 DIAGNOSIS — O23593 Infection of other part of genital tract in pregnancy, third trimester: Secondary | ICD-10-CM | POA: Diagnosis not present

## 2019-06-07 DIAGNOSIS — O26893 Other specified pregnancy related conditions, third trimester: Secondary | ICD-10-CM

## 2019-06-07 DIAGNOSIS — B9689 Other specified bacterial agents as the cause of diseases classified elsewhere: Secondary | ICD-10-CM

## 2019-06-07 DIAGNOSIS — N760 Acute vaginitis: Secondary | ICD-10-CM | POA: Diagnosis present

## 2019-06-07 LAB — PROTEIN / CREATININE RATIO, URINE
Creatinine, Urine: 199.36 mg/dL
Protein Creatinine Ratio: 0.06 mg/mg{Cre} (ref 0.00–0.15)
Total Protein, Urine: 12 mg/dL

## 2019-06-07 LAB — COMPREHENSIVE METABOLIC PANEL
ALT: 14 U/L (ref 0–44)
AST: 16 U/L (ref 15–41)
Albumin: 2.9 g/dL — ABNORMAL LOW (ref 3.5–5.0)
Alkaline Phosphatase: 95 U/L (ref 38–126)
Anion gap: 10 (ref 5–15)
BUN: 6 mg/dL (ref 6–20)
CO2: 19 mmol/L — ABNORMAL LOW (ref 22–32)
Calcium: 9 mg/dL (ref 8.9–10.3)
Chloride: 107 mmol/L (ref 98–111)
Creatinine, Ser: 0.69 mg/dL (ref 0.44–1.00)
GFR calc Af Amer: >60 mL/min (ref >60)
GFR calc non Af Amer: >60 mL/min (ref >60)
Glucose, Bld: 95 mg/dL (ref 70–99)
Potassium: 3.7 mmol/L (ref 3.5–5.1)
Sodium: 136 mmol/L (ref 135–145)
Total Bilirubin: 0.2 mg/dL — ABNORMAL LOW (ref 0.3–1.2)
Total Protein: 6.7 g/dL (ref 6.5–8.1)

## 2019-06-07 LAB — URINALYSIS, ROUTINE W REFLEX MICROSCOPIC
Bilirubin Urine: NEGATIVE
Glucose, UA: NEGATIVE mg/dL
Hgb urine dipstick: NEGATIVE
Ketones, ur: NEGATIVE mg/dL
Leukocytes,Ua: NEGATIVE
Nitrite: NEGATIVE
Protein, ur: NEGATIVE mg/dL
Specific Gravity, Urine: 1.019 (ref 1.005–1.030)
pH: 6 (ref 5.0–8.0)

## 2019-06-07 LAB — WET PREP, GENITAL
Sperm: NONE SEEN
Trich, Wet Prep: NONE SEEN
Yeast Wet Prep HPF POC: NONE SEEN

## 2019-06-07 LAB — CBC
HCT: 35.9 % — ABNORMAL LOW (ref 36.0–46.0)
Hemoglobin: 11.9 g/dL — ABNORMAL LOW (ref 12.0–15.0)
MCH: 26.9 pg (ref 26.0–34.0)
MCHC: 33.1 g/dL (ref 30.0–36.0)
MCV: 81 fL (ref 80.0–100.0)
Platelets: 328 10*3/uL (ref 150–400)
RBC: 4.43 MIL/uL (ref 3.87–5.11)
RDW: 13.7 % (ref 11.5–15.5)
WBC: 12.9 10*3/uL — ABNORMAL HIGH (ref 4.0–10.5)
nRBC: 0 % (ref 0.0–0.2)

## 2019-06-07 MED ORDER — COMFORT FIT MATERNITY SUPP SM MISC: 1.0000 [IU] | Freq: Every day | 0 refills | Status: DC | PRN

## 2019-06-07 MED ORDER — METRONIDAZOLE 500 MG PO TABS: 500.0000 mg | ORAL_TABLET | Freq: Two times a day (BID) | ORAL | 0 refills | Status: AC

## 2019-06-07 MED ORDER — CYCLOBENZAPRINE HCL 10 MG PO TABS
10.0000 mg | ORAL_TABLET | Freq: Once | ORAL | Status: AC
  Administered 2019-06-07: 10 mg via ORAL
  Filled 2019-06-07: qty 1

## 2019-06-07 MED ORDER — NIFEDIPINE 10 MG PO CAPS
10.0000 mg | ORAL_CAPSULE | ORAL | Status: DC
  Administered 2019-06-07 (×2): 10 mg via ORAL
  Filled 2019-06-07 (×2): qty 1

## 2019-06-07 NOTE — Progress Notes (Addendum)
Pt says the pelvic pain started after the lower abd cramping tonight.  0411-  Pt says the only time she feels the pelvic pain is when the baby moves and when she gets up to walk.

## 2019-06-07 NOTE — MAU Provider Note (Signed)
History     CSN: [id number]  Arrival date and time: 06/07/19 [id number]   First Provider Initiated Contact with Patient 06/07/19 0303      Chief Complaint  Patient presents with  . Contractions   HPI  Ms.  Sophia Oneill is a 33 y.o. year old [id number] female at 2567w1d weeks gestation who presents to MAU reporting increased pelvic pain/pressure with walking since 1800 on 06/06/19. She reports the pain was so bad that she "couldn't walk." She took a warm bath and got relief, but as soon as finished pain returned. She reports that she started having abdominal pain/cramping after her bath. She has an on-going h/o pelvic pain during this pregnancy. She reports going to weekly physical therapy. She has not been Rx'd a maternity support belt or medication for this problem. She receives prenatal care with [facility]. Jul[facility]ody at [facility].  Past Medical History:  Diagnosis Date  . GDM (gestational diabetes mellitus) 04/2019  . Medical history non-contributory   . PCOS (polycystic ovarian syndrome) 02/2013    Past Surgical History:  Procedure Laterality Date  . NO PAST SURGERIES    . WISDOM TOOTH EXTRACTION      Family History  Problem Relation Age of Onset  . Hypertension Mother   . Diabetes Father        DM Type II  . Hypertension Father   . Diabetes Sister   . Birth defects Sister        cleft lip and palate  . Heart disease Maternal Grandfather     Social History   Tobacco Use  . Smoking status: Never Smoker  . Smokeless tobacco: Never Used  Substance Use Topics  . Alcohol use: No  . Drug use: No    Allergies:  Allergies  Allergen Reactions  . Amoxicillin     hives  . Penicillins     hives    No medications prior to admission.    Review of Systems  Constitutional: Negative.   HENT: Negative.   Eyes: Negative.   Respiratory: Negative.   Cardiovascular: Negative.   Gastrointestinal: Negative.   Endocrine: Negative.   Genitourinary: Positive for pelvic pain (on-going issue,  goes to PT every week; pain got worse this evening "couldn't walk").  Musculoskeletal: Negative.   Skin: Negative.   Allergic/Immunologic: Negative.   Neurological: Negative.   Hematological: Negative.   Psychiatric/Behavioral: Negative.    Physical Exam   Patient Vitals for the past 24 hrs:  BP Temp Temp src Pulse Resp Height Weight  06/07/19 0501 123/75 - - (!) 103 - - -  06/07/19 0435 133/82 - - - - - -  06/07/19 0430 133/82 - - - - - -  06/07/19 0415 133/85 - - - - - -  06/07/19 0325 129/84 - - (!) 109 - - -  06/07/19 0248 (!) 141/81 - - - - - -  06/07/19 0235 (!) 143/91 98.2 F (36.8 C) Oral (!) 113 15 - -  06/07/19 0224 - - - - - 5\' 3"  (1.6 m) 95.3 kg     Physical Exam  Nursing note and vitals reviewed. Constitutional: She is oriented to person, place, and time. She appears well-developed and well-nourished.  HENT:  Head: Normocephalic and atraumatic.  Eyes: Pupils are equal, round, and reactive to light.  Neck: Normal range of motion.  Cardiovascular: Normal rate.  Respiratory: Effort normal.  GI: Soft.  Genitourinary:    Genitourinary Comments: Uterus: gravid, S=D, SE: cervix is smooth, pink, no  lesions, scant amt of thin, white vaginal d/c -- WP, GC/CT done, closed/60%/soft, no CMT or friability, no adnexal tenderness    Musculoskeletal: Normal range of motion.  Neurological: She is alert and oriented to person, place, and time.  Skin: Skin is warm and dry.  Psychiatric: She has a normal mood and affect. Her behavior is normal. Judgment and thought content normal.   NST - FHR: 140 bpm / moderate variability / accels present / decels absent / TOCO: regular every 5-10 mins; spaced out after Procardia  MAU Course  Procedures  MDM CCUA CBC CMP P/C Ratio Serial BP's  NST  Flexeril 10 mg po -- improved pain Procardia 10 mg every 20 mins x 3 doses -- only 1 dose given, UC's spaced out to >/=15 mins  Results for orders placed or performed during the hospital  encounter of 06/07/19 (from the past 24 hour(s))  Wet prep, genital     Status: Abnormal   Collection Time: 06/07/19  3:25 AM   Specimen: Vaginal  Result Value Ref Range   Yeast Wet Prep HPF POC NONE SEEN NONE SEEN   Trich, Wet Prep NONE SEEN NONE SEEN   Clue Cells Wet Prep HPF POC PRESENT (A) NONE SEEN   WBC, Wet Prep HPF POC MODERATE (A) NONE SEEN   Sperm NONE SEEN   CBC     Status: Abnormal   Collection Time: 06/07/19  3:35 AM  Result Value Ref Range   WBC 12.9 (H) 4.0 - 10.5 K/uL   RBC 4.43 3.87 - 5.11 MIL/uL   Hemoglobin 11.9 (L) 12.0 - 15.0 g/dL   HCT 69.635.9 (L) 29.536.0 - 28.446.0 %   MCV 81.0 80.0 - 100.0 fL   MCH 26.9 26.0 - 34.0 pg   MCHC 33.1 30.0 - 36.0 g/dL   RDW 13.213.7 44.011.5 - 10.215.5 %   Platelets 328 150 - 400 K/uL   nRBC 0.0 0.0 - 0.2 %  Comprehensive metabolic panel     Status: Abnormal   Collection Time: 06/07/19  3:35 AM  Result Value Ref Range   Sodium 136 135 - 145 mmol/L   Potassium 3.7 3.5 - 5.1 mmol/L   Chloride 107 98 - 111 mmol/L   CO2 19 (L) 22 - 32 mmol/L   Glucose, Bld 95 70 - 99 mg/dL   BUN 6 6 - 20 mg/dL   Creatinine, Ser 7.250.69 0.44 - 1.00 mg/dL   Calcium 9.0 8.9 - 36.610.3 mg/dL   Total Protein 6.7 6.5 - 8.1 g/dL   Albumin 2.9 (L) 3.5 - 5.0 g/dL   AST 16 15 - 41 U/L   ALT 14 0 - 44 U/L   Alkaline Phosphatase 95 38 - 126 U/L   Total Bilirubin 0.2 (L) 0.3 - 1.2 mg/dL   GFR calc non Af Amer >60 >60 mL/min   GFR calc Af Amer >60 >60 mL/min   Anion gap 10 5 - 15  Protein / creatinine ratio, urine     Status: None   Collection Time: 06/07/19  3:48 AM  Result Value Ref Range   Creatinine, Urine 199.36 mg/dL   Total Protein, Urine 12 mg/dL   Protein Creatinine Ratio 0.06 0.00 - 0.15 mg/mg[Cre]  Urinalysis, Routine w reflex microscopic     Status: Abnormal   Collection Time: 06/07/19  3:48 AM  Result Value Ref Range   Color, Urine YELLOW YELLOW   APPearance HAZY (A) CLEAR   Specific Gravity, Urine 1.019 1.005 - 1.030  pH 6.0 5.0 - 8.0   Glucose, UA NEGATIVE  NEGATIVE mg/dL   Hgb urine dipstick NEGATIVE NEGATIVE   Bilirubin Urine NEGATIVE NEGATIVE   Ketones, ur NEGATIVE NEGATIVE mg/dL   Protein, ur NEGATIVE NEGATIVE mg/dL   Nitrite NEGATIVE NEGATIVE   Leukocytes,Ua NEGATIVE NEGATIVE      Assessment and Plan  Bacterial vaginosis  - Plan: Rx for Flagyl 500 mg BID x 7 days - Information provided on BV   Pelvic pressure in pregnancy, antepartum, third trimester  - Plan: Rx for maternity support belt given - Information provided on abd pain preg    - Discharge patient - Reassurance given that PEC labs were normal - Discussed s/sx's of PEC and when to call MD office or return to MAU - Keep scheduled appt with Dr. Benjie Karvonen  - Patient verbalized an understanding of the plan of care and agrees.     Laury Deep, MSN, CNM 06/07/2019, 3:03 AM

## 2019-06-07 NOTE — MAU Note (Signed)
Pt states lower abd cramping started around 1800; she took a bath and it helped but as soon as she got out of the tub the pelvic pressure started and the cramping continued to come intermittently. Says baby is moving well and denies any vag bleeding or leaking.

## 2019-06-07 NOTE — Discharge Instructions (Signed)
You can pick up a maternity support belt at: °[facility]  °[address] [place], [place] [address]  °[phone]  °[email or web address] ° °

## 2019-06-09 LAB — GC/CHLAMYDIA PROBE AMP (~~LOC~~) NOT AT ARMC
Chlamydia: NEGATIVE
Neisseria Gonorrhea: NEGATIVE

## 2019-06-20 ENCOUNTER — Other Ambulatory Visit (HOSPITAL_COMMUNITY)
Admission: RE | Admit: 2019-06-20 | Discharge: 2019-06-20 | Disposition: A | Discharge status: Home / Self Care | Payer: No Typology Code available for payment source | Source: Ambulatory Visit | Attending: Obstetrics and Gynecology | Admitting: Obstetrics and Gynecology

## 2019-06-20 ENCOUNTER — Other Ambulatory Visit: Payer: Self-pay

## 2019-06-20 ENCOUNTER — Encounter (HOSPITAL_COMMUNITY): Payer: Self-pay

## 2019-06-20 ENCOUNTER — Telehealth (HOSPITAL_COMMUNITY): Payer: Self-pay

## 2019-06-20 DIAGNOSIS — Z1159 Encounter for screening for other viral diseases: Secondary | ICD-10-CM | POA: Insufficient documentation

## 2019-06-20 LAB — SARS CORONAVIRUS 2 (TAT 6-24 HRS): SARS Coronavirus 2: NEGATIVE

## 2019-06-20 NOTE — MAU Note (Signed)
Sweet pt.  Asymptomatic.  Swab collected without problem.

## 2019-06-20 NOTE — Telephone Encounter (Signed)
Preadmission screen  

## 2019-06-23 NOTE — H&P (Signed)
Sophia Oneill is a 33 y.o. female presenting for external cephalic version. 37 wks, persistent breech. AGA. Prior term SVD 5'14"  A1GDM, well controlled, no meds  OB History    Gravida  3   Para  1   Term  1   Preterm      AB  1   Living  1     SAB  1   TAB      Ectopic      Multiple      Live Births  1          Past Medical History:  Diagnosis Date  . Depression   . GDM (gestational diabetes mellitus) 04/2019  . GERD (gastroesophageal reflux disease)   . Gestational diabetes   . PCOS (polycystic ovarian syndrome) 02/2013  . Tachycardia    Past Surgical History:  Procedure Laterality Date  . WISDOM TOOTH EXTRACTION     Family History: family history includes Birth defects in her sister; Diabetes in her father and sister; Heart disease in her maternal grandfather; Hypertension in her father and mother. Social History:  reports that she has never smoked. She has never used smokeless tobacco. She reports that she does not drink alcohol or use drugs.     Maternal Diabetes: Yes:  Diabetes Type:  Diet controlled Genetic Screening: Normal Maternal Ultrasounds/Referrals: Normal Fetal Ultrasounds or other Referrals:  None Maternal Substance Abuse:  No Significant Maternal Medications:  Meds include: Other: Lansoprazole, Zofran Significant Maternal Lab Results:  Group B Strep negative and Rh negative Other Comments:  None  ROS History   There were no vitals taken for this visit. Exam Physical Exam  See other note  Prenatal labs: ABO, Rh: O/Negative/-- (01/03 0000) Antibody: Negative (01/03 0000) Rubella: Immune (01/07 0000) RPR: Nonreactive (01/07 0000)  HBsAg: Negative (01/07 0000)  HIV: Non-reactive (01/07 0000)  GBS:   neg (6/24)  Assessment/Plan: 37 wks, Breech. Here for external cephalic version.  Informed written consent obtained, risk of fetal distress, ROM, abruptio with emergency c/s reviewed.     Sophia Oneill 06/23/2019, 10:29  PM

## 2019-06-24 ENCOUNTER — Other Ambulatory Visit: Payer: Self-pay

## 2019-06-24 ENCOUNTER — Observation Stay (HOSPITAL_COMMUNITY)
Admission: RE | Admit: 2019-06-24 | Discharge: 2019-06-24 | DRG: 833 | Disposition: A | Discharge status: Home / Self Care | Payer: No Typology Code available for payment source | Attending: Obstetrics & Gynecology | Admitting: Obstetrics & Gynecology

## 2019-06-24 ENCOUNTER — Encounter (HOSPITAL_COMMUNITY): Payer: Self-pay | Admitting: *Deleted

## 2019-06-24 ENCOUNTER — Ambulatory Visit (HOSPITAL_COMMUNITY)
Admission: RE | Admit: 2019-06-24 | Discharge: 2019-06-24 | Disposition: A | Discharge status: Home / Self Care | Payer: No Typology Code available for payment source | Source: Ambulatory Visit | Attending: Obstetrics & Gynecology | Admitting: Obstetrics & Gynecology

## 2019-06-24 ENCOUNTER — Encounter (HOSPITAL_COMMUNITY): Payer: Self-pay

## 2019-06-24 DIAGNOSIS — Z6791 Unspecified blood type, Rh negative: Secondary | ICD-10-CM | POA: Diagnosis not present

## 2019-06-24 DIAGNOSIS — O24410 Gestational diabetes mellitus in pregnancy, diet controlled: Secondary | ICD-10-CM | POA: Diagnosis present

## 2019-06-24 DIAGNOSIS — O26893 Other specified pregnancy related conditions, third trimester: Secondary | ICD-10-CM | POA: Diagnosis present

## 2019-06-24 DIAGNOSIS — Z3A37 37 weeks gestation of pregnancy: Secondary | ICD-10-CM

## 2019-06-24 DIAGNOSIS — O321XX0 Maternal care for breech presentation, not applicable or unspecified: Principal | ICD-10-CM | POA: Diagnosis present

## 2019-06-24 MED ORDER — TERBUTALINE SULFATE 1 MG/ML IJ SOLN
0.2500 mg | Freq: Once | INTRAMUSCULAR | Status: AC
  Administered 2019-06-24: 0.25 mg via SUBCUTANEOUS

## 2019-06-24 MED ORDER — TERBUTALINE SULFATE 1 MG/ML IJ SOLN
INTRAMUSCULAR | Status: AC
  Filled 2019-06-24: qty 1

## 2019-06-24 NOTE — Procedures (Signed)
Procedure Note: Procedure: External Cephalic version Pre-op: Sophia Oneill, 37 wks Post-op: Sophia Oneill, failed version  Surgeon: Azucena Fallen, MD Assistant: Lars Pinks, CNM   Bedside sono confirmed baby in [place] position and visually normal AFI. Informed written consent obtained for external cephalic version and possible emergency c-section for fetal distress. Terbutaline 0.25mg  subcu given. Vital signs nl.  Version was attempted 4 times. First two in clockwise fashion moving head forward from fundus to the left but didn't move beyond LUQ. Then we attempted counter-clockwise, flipping backward, moving head to RUQ, that didn't budge the head at all. Last attempt was again in forward direction but head didn't move beyond LUQ. Fetal wellbeing checked with intermittently checking fetal cardiac activity by bedside sono.  Procedure was aborted. Failed version. Monitor for 1 hour and if stable and NST reactive, discharge home F/up office 1 week Strict precautions reviewed.   I performed the procedure. V.Seith Aikey, MD

## 2019-06-24 NOTE — OB Triage Note (Signed)
Unsuccessful version. Dr MOdy gave pt discharge instructions. pts vitals stable fhr in 150's no variables

## 2019-06-24 NOTE — Discharge Instructions (Signed)

## 2019-06-24 NOTE — Discharge Summary (Signed)
Physician Discharge Summary   Patient ID: Sophia Oneill [id number] 33 y.o. 06-12-1986  Admit date: 06/24/2019  Discharge date and time: 06/24/2019  9:44 AM   Admitting Physician: Azucena Fallen, MD   Admission Diagnoses:  Johnney Ou. 37 wks. Admission for External Cephalic version   Discharge Diagnoses: Failed external cephalic version, Breech persists   Discharged Condition: good  Discharge Exam: Normal vitals FHT - reactive 140s, + accels, no decels, moderate variability- cat I/ reactive Toco- none   Disposition: Discharge disposition: 01-Home or Self Care       Patient Instructions:  Allergies as of 06/24/2019      Reactions   Amoxicillin    hives   Penicillins    hives      Medication List    TAKE these medications   albuterol 108 (90 Base) MCG/ACT inhaler Commonly known as: VENTOLIN HFA Inhale 2 puffs every 6 (six) hours as needed into the lungs for wheezing or shortness of breath.   Comfort Fit Maternity Supp Sm Misc 1 Units by Does not apply route daily as needed.   diphenhydrAMINE 25 MG tablet Commonly known as: BENADRYL Take 25 mg by mouth at bedtime as needed for allergies.   fluticasone 50 MCG/ACT nasal spray Commonly known as: FLONASE Place 2 sprays into both nostrils daily.   montelukast 10 MG tablet Commonly known as: SINGULAIR Take 1 tablet (10 mg total) by mouth at bedtime.   multivitamin-prenatal 27-0.8 MG Tabs tablet Take 1 tablet by mouth daily at 12 noon.      Activity: activity as tolerated Diet: GDM diet   Follow-up with 1 week  Signed: Elveria Royals 06/24/2019 2:22 PM
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
## 2019-06-25 ENCOUNTER — Other Ambulatory Visit: Payer: Self-pay | Admitting: Obstetrics & Gynecology

## 2019-06-30 ENCOUNTER — Encounter (HOSPITAL_COMMUNITY): Payer: Self-pay

## 2019-06-30 NOTE — Patient Instructions (Signed)
Sophia Oneill  06/30/2019   Your procedure is scheduled on:  07/09/2019  Arrive at 53 at Entrance C on [address] at [facility]  and [employer]. You are invited to use the FREE valet parking or use the Visitor's parking deck.  Pick up the phone at the desk and dial [phone].  Call this number if you have problems the morning of surgery: [phone]  Remember:   Do not eat food:(After Midnight) Desps de medianoche.  Do not drink clear liquids: (After Midnight) Desps de medianoche.  Take these medicines the morning of surgery with A SIP OF WATER:  none   Do not wear jewelry, make-up or nail polish.  Do not wear lotions, powders, or perfumes. Do not wear deodorant.  Do not shave 48 hours prior to surgery.  Do not bring valuables to the hospital.  [facility] is not   responsible for any belongings or valuables brought to the hospital.  Contacts, dentures or bridgework may not be worn into surgery.  Leave suitcase in the car. After surgery it may be brought to your room.  For patients admitted to the hospital, checkout time is 11:00 AM the day of              discharge.      Please read over the following fact sheets that you were given:     Preparing for Surgery

## 2019-07-01 ENCOUNTER — Encounter (HOSPITAL_COMMUNITY): Payer: Self-pay | Admitting: *Deleted

## 2019-07-01 ENCOUNTER — Other Ambulatory Visit: Payer: Self-pay

## 2019-07-01 ENCOUNTER — Inpatient Hospital Stay (HOSPITAL_COMMUNITY): Payer: No Typology Code available for payment source | Admitting: Certified Registered Nurse Anesthetist

## 2019-07-01 ENCOUNTER — Inpatient Hospital Stay (HOSPITAL_COMMUNITY): Admit: 2019-07-01 | Payer: No Typology Code available for payment source | Admitting: Obstetrics and Gynecology

## 2019-07-01 ENCOUNTER — Encounter (HOSPITAL_COMMUNITY)
Admission: AD | Disposition: A | Discharge status: Home / Self Care | Payer: Self-pay | Source: Home / Self Care | Attending: Obstetrics & Gynecology

## 2019-07-01 ENCOUNTER — Inpatient Hospital Stay (HOSPITAL_COMMUNITY)
Admission: AD | Admit: 2019-07-01 | Discharge: 2019-07-03 | DRG: 787 | Disposition: A | Discharge status: Home / Self Care | Payer: No Typology Code available for payment source | Attending: Obstetrics & Gynecology | Admitting: Obstetrics & Gynecology

## 2019-07-01 DIAGNOSIS — O99214 Obesity complicating childbirth: Secondary | ICD-10-CM | POA: Diagnosis present

## 2019-07-01 DIAGNOSIS — Z6791 Unspecified blood type, Rh negative: Secondary | ICD-10-CM | POA: Diagnosis not present

## 2019-07-01 DIAGNOSIS — E669 Obesity, unspecified: Secondary | ICD-10-CM | POA: Diagnosis present

## 2019-07-01 DIAGNOSIS — O24420 Gestational diabetes mellitus in childbirth, diet controlled: Secondary | ICD-10-CM | POA: Diagnosis present

## 2019-07-01 DIAGNOSIS — Z3A38 38 weeks gestation of pregnancy: Secondary | ICD-10-CM | POA: Diagnosis not present

## 2019-07-01 DIAGNOSIS — O4290 Premature rupture of membranes, unspecified as to length of time between rupture and onset of labor, unspecified weeks of gestation: Secondary | ICD-10-CM

## 2019-07-01 DIAGNOSIS — D62 Acute posthemorrhagic anemia: Secondary | ICD-10-CM | POA: Diagnosis not present

## 2019-07-01 DIAGNOSIS — O321XX0 Maternal care for breech presentation, not applicable or unspecified: Principal | ICD-10-CM | POA: Diagnosis present

## 2019-07-01 DIAGNOSIS — O133 Gestational [pregnancy-induced] hypertension without significant proteinuria, third trimester: Secondary | ICD-10-CM

## 2019-07-01 DIAGNOSIS — O42913 Preterm premature rupture of membranes, unspecified as to length of time between rupture and onset of labor, third trimester: Secondary | ICD-10-CM

## 2019-07-01 DIAGNOSIS — O26893 Other specified pregnancy related conditions, third trimester: Secondary | ICD-10-CM | POA: Diagnosis present

## 2019-07-01 DIAGNOSIS — O9081 Anemia of the puerperium: Secondary | ICD-10-CM | POA: Diagnosis not present

## 2019-07-01 DIAGNOSIS — O134 Gestational [pregnancy-induced] hypertension without significant proteinuria, complicating childbirth: Secondary | ICD-10-CM | POA: Diagnosis present

## 2019-07-01 DIAGNOSIS — Z98891 History of uterine scar from previous surgery: Secondary | ICD-10-CM

## 2019-07-01 DIAGNOSIS — Z1159 Encounter for screening for other viral diseases: Secondary | ICD-10-CM | POA: Diagnosis not present

## 2019-07-01 LAB — URINALYSIS, ROUTINE W REFLEX MICROSCOPIC
Bilirubin Urine: NEGATIVE
Glucose, UA: NEGATIVE mg/dL
Hgb urine dipstick: NEGATIVE
Ketones, ur: NEGATIVE mg/dL
Leukocytes,Ua: NEGATIVE
Nitrite: NEGATIVE
Protein, ur: NEGATIVE mg/dL
Specific Gravity, Urine: 1.012 (ref 1.005–1.030)
pH: 6 (ref 5.0–8.0)

## 2019-07-01 LAB — PROTEIN / CREATININE RATIO, URINE
Creatinine, Urine: 109.36 mg/dL
Protein Creatinine Ratio: 0.09 mg/mg{Cre} (ref 0.00–0.15)
Total Protein, Urine: 10 mg/dL

## 2019-07-01 LAB — COMPREHENSIVE METABOLIC PANEL
ALT: 13 U/L (ref 0–44)
AST: 13 U/L — ABNORMAL LOW (ref 15–41)
Albumin: 3 g/dL — ABNORMAL LOW (ref 3.5–5.0)
Alkaline Phosphatase: 137 U/L — ABNORMAL HIGH (ref 38–126)
Anion gap: 11 (ref 5–15)
BUN: 7 mg/dL (ref 6–20)
CO2: 20 mmol/L — ABNORMAL LOW (ref 22–32)
Calcium: 9.3 mg/dL (ref 8.9–10.3)
Chloride: 106 mmol/L (ref 98–111)
Creatinine, Ser: 0.82 mg/dL (ref 0.44–1.00)
GFR calc Af Amer: >60 mL/min (ref >60)
GFR calc non Af Amer: >60 mL/min (ref >60)
Glucose, Bld: 90 mg/dL (ref 70–99)
Potassium: 4 mmol/L (ref 3.5–5.1)
Sodium: 137 mmol/L (ref 135–145)
Total Bilirubin: 0.7 mg/dL (ref 0.3–1.2)
Total Protein: 7 g/dL (ref 6.5–8.1)

## 2019-07-01 LAB — AMNISURE RUPTURE OF MEMBRANE (ROM) NOT AT ARMC: Amnisure ROM: POSITIVE

## 2019-07-01 LAB — GLUCOSE, CAPILLARY
Glucose-Capillary: 55 mg/dL — ABNORMAL LOW (ref 70–99)
Glucose-Capillary: 56 mg/dL — ABNORMAL LOW (ref 70–99)
Glucose-Capillary: 85 mg/dL (ref 70–99)

## 2019-07-01 LAB — CBC
HCT: 40.7 % (ref 36.0–46.0)
Hemoglobin: 13.4 g/dL (ref 12.0–15.0)
MCH: 26.5 pg (ref 26.0–34.0)
MCHC: 32.9 g/dL (ref 30.0–36.0)
MCV: 80.4 fL (ref 80.0–100.0)
Platelets: 322 10*3/uL (ref 150–400)
RBC: 5.06 MIL/uL (ref 3.87–5.11)
RDW: 14.6 % (ref 11.5–15.5)
WBC: 12.8 10*3/uL — ABNORMAL HIGH (ref 4.0–10.5)
nRBC: 0 % (ref 0.0–0.2)

## 2019-07-01 LAB — SARS CORONAVIRUS 2 BY RT PCR (HOSPITAL ORDER, PERFORMED IN ~~LOC~~ HOSPITAL LAB): SARS Coronavirus 2: NEGATIVE

## 2019-07-01 SURGERY — Surgical Case
Anesthesia: Spinal

## 2019-07-01 MED ORDER — NALOXONE HCL 4 MG/10ML IJ SOLN
1.0000 ug/kg/h | INTRAVENOUS | Status: DC | PRN
  Filled 2019-07-01: qty 5

## 2019-07-01 MED ORDER — MORPHINE SULFATE (PF) 0.5 MG/ML IJ SOLN
INTRAMUSCULAR | Status: AC
  Filled 2019-07-01: qty 10

## 2019-07-01 MED ORDER — NALBUPHINE HCL 10 MG/ML IJ SOLN: 5.0000 mg | Freq: Once | INTRAMUSCULAR | Status: DC | PRN

## 2019-07-01 MED ORDER — HYDROMORPHONE HCL 1 MG/ML IJ SOLN
INTRAMUSCULAR | Status: AC
  Filled 2019-07-01: qty 0.5

## 2019-07-01 MED ORDER — SODIUM CHLORIDE 0.9 % IV SOLN
INTRAVENOUS | Status: DC | PRN
  Administered 2019-07-01: 60 ug/min via INTRAVENOUS

## 2019-07-01 MED ORDER — METOCLOPRAMIDE HCL 5 MG/ML IJ SOLN
INTRAMUSCULAR | Status: AC
  Filled 2019-07-01: qty 2

## 2019-07-01 MED ORDER — OXYCODONE HCL 5 MG/5ML PO SOLN: 5.0000 mg | Freq: Once | ORAL | Status: DC | PRN

## 2019-07-01 MED ORDER — WITCH HAZEL-GLYCERIN EX PADS: 1.0000 "application " | MEDICATED_PAD | CUTANEOUS | Status: DC | PRN

## 2019-07-01 MED ORDER — SCOPOLAMINE 1 MG/3DAYS TD PT72
1.0000 | MEDICATED_PATCH | Freq: Once | TRANSDERMAL | Status: DC
  Administered 2019-07-01: 1.5 mg via TRANSDERMAL

## 2019-07-01 MED ORDER — FAMOTIDINE IN NACL 20-0.9 MG/50ML-% IV SOLN
20.0000 mg | Freq: Once | INTRAVENOUS | Status: AC
  Administered 2019-07-01: 20 mg via INTRAVENOUS
  Filled 2019-07-01: qty 50

## 2019-07-01 MED ORDER — ENOXAPARIN SODIUM 40 MG/0.4ML ~~LOC~~ SOLN
40.0000 mg | SUBCUTANEOUS | Status: DC
  Administered 2019-07-02 – 2019-07-03 (×2): 40 mg via SUBCUTANEOUS
  Filled 2019-07-01 (×2): qty 0.4

## 2019-07-01 MED ORDER — SENNOSIDES-DOCUSATE SODIUM 8.6-50 MG PO TABS
2.0000 | ORAL_TABLET | ORAL | Status: DC
  Administered 2019-07-01 – 2019-07-02 (×2): 2 via ORAL
  Filled 2019-07-01 (×3): qty 2

## 2019-07-01 MED ORDER — OXYCODONE HCL 5 MG PO TABS: 5.0000 mg | ORAL_TABLET | Freq: Once | ORAL | Status: DC | PRN

## 2019-07-01 MED ORDER — IBUPROFEN 800 MG PO TABS
800.0000 mg | ORAL_TABLET | Freq: Three times a day (TID) | ORAL | Status: DC
  Administered 2019-07-02 – 2019-07-03 (×4): 800 mg via ORAL
  Filled 2019-07-01 (×4): qty 1

## 2019-07-01 MED ORDER — CEFAZOLIN SODIUM-DEXTROSE 2-4 GM/100ML-% IV SOLN
2.0000 g | INTRAVENOUS | Status: DC
  Filled 2019-07-01: qty 100

## 2019-07-01 MED ORDER — DEXAMETHASONE SODIUM PHOSPHATE 4 MG/ML IJ SOLN
INTRAMUSCULAR | Status: AC
  Filled 2019-07-01: qty 7

## 2019-07-01 MED ORDER — NALBUPHINE HCL 10 MG/ML IJ SOLN: 5.0000 mg | INTRAMUSCULAR | Status: DC | PRN

## 2019-07-01 MED ORDER — KETOROLAC TROMETHAMINE 30 MG/ML IJ SOLN
INTRAMUSCULAR | Status: AC
  Filled 2019-07-01: qty 1

## 2019-07-01 MED ORDER — SODIUM CHLORIDE 0.9 % IV SOLN
INTRAVENOUS | Status: DC | PRN
  Administered 2019-07-01: 19:00:00 via INTRAVENOUS

## 2019-07-01 MED ORDER — LACTATED RINGERS IV SOLN
INTRAVENOUS | Status: DC
  Administered 2019-07-01: 16:00:00 via INTRAVENOUS

## 2019-07-01 MED ORDER — SCOPOLAMINE 1 MG/3DAYS TD PT72
MEDICATED_PATCH | TRANSDERMAL | Status: DC | PRN
  Administered 2019-07-01: 1 via TRANSDERMAL

## 2019-07-01 MED ORDER — SIMETHICONE 80 MG PO CHEW
80.0000 mg | CHEWABLE_TABLET | Freq: Three times a day (TID) | ORAL | Status: DC
  Administered 2019-07-02 – 2019-07-03 (×3): 80 mg via ORAL
  Filled 2019-07-01 (×5): qty 1

## 2019-07-01 MED ORDER — LACTATED RINGERS IV SOLN
INTRAVENOUS | Status: DC
  Administered 2019-07-02: 04:00:00 via INTRAVENOUS

## 2019-07-01 MED ORDER — MONTELUKAST SODIUM 10 MG PO TABS
10.0000 mg | ORAL_TABLET | Freq: Every day | ORAL | Status: DC
  Administered 2019-07-02: 10 mg via ORAL
  Filled 2019-07-01 (×2): qty 1

## 2019-07-01 MED ORDER — METOCLOPRAMIDE HCL 5 MG/ML IJ SOLN
INTRAMUSCULAR | Status: DC | PRN
  Administered 2019-07-01: 10 mg via INTRAVENOUS

## 2019-07-01 MED ORDER — LACTATED RINGERS IV SOLN
INTRAVENOUS | Status: DC | PRN
  Administered 2019-07-01: 18:00:00 via INTRAVENOUS

## 2019-07-01 MED ORDER — SIMETHICONE 80 MG PO CHEW
80.0000 mg | CHEWABLE_TABLET | ORAL | Status: DC | PRN
  Filled 2019-07-01: qty 1

## 2019-07-01 MED ORDER — OXYTOCIN 10 UNIT/ML IJ SOLN
INTRAMUSCULAR | Status: DC | PRN
  Administered 2019-07-01: 30 [IU]
  Administered 2019-07-01: 40 [IU]

## 2019-07-01 MED ORDER — SOD CITRATE-CITRIC ACID 500-334 MG/5ML PO SOLN
30.0000 mL | Freq: Once | ORAL | Status: AC
  Administered 2019-07-01: 30 mL via ORAL
  Filled 2019-07-01: qty 30

## 2019-07-01 MED ORDER — DIPHENHYDRAMINE HCL 25 MG PO CAPS: 25.0000 mg | ORAL_CAPSULE | Freq: Four times a day (QID) | ORAL | Status: DC | PRN

## 2019-07-01 MED ORDER — DIPHENHYDRAMINE HCL 50 MG/ML IJ SOLN
12.5000 mg | INTRAMUSCULAR | Status: DC | PRN
  Administered 2019-07-02: 12.5 mg via INTRAVENOUS
  Filled 2019-07-01 (×2): qty 1

## 2019-07-01 MED ORDER — DIPHENHYDRAMINE HCL 25 MG PO CAPS
25.0000 mg | ORAL_CAPSULE | ORAL | Status: DC | PRN
  Administered 2019-07-02: 25 mg via ORAL
  Filled 2019-07-01: qty 1

## 2019-07-01 MED ORDER — OXYTOCIN 40 UNITS IN NORMAL SALINE INFUSION - SIMPLE MED
INTRAVENOUS | Status: AC
  Filled 2019-07-01: qty 1000

## 2019-07-01 MED ORDER — NALOXONE HCL 0.4 MG/ML IJ SOLN: 0.4000 mg | INTRAMUSCULAR | Status: DC | PRN

## 2019-07-01 MED ORDER — SIMETHICONE 80 MG PO CHEW
80.0000 mg | CHEWABLE_TABLET | ORAL | Status: DC
  Administered 2019-07-01 – 2019-07-02 (×3): 80 mg via ORAL
  Filled 2019-07-01 (×3): qty 1

## 2019-07-01 MED ORDER — ONDANSETRON HCL 4 MG/2ML IJ SOLN
INTRAMUSCULAR | Status: AC
  Filled 2019-07-01: qty 2

## 2019-07-01 MED ORDER — ALBUTEROL SULFATE (2.5 MG/3ML) 0.083% IN NEBU: 2.5000 mg | INHALATION_SOLUTION | Freq: Four times a day (QID) | RESPIRATORY_TRACT | Status: DC | PRN

## 2019-07-01 MED ORDER — PROMETHAZINE HCL 25 MG/ML IJ SOLN: 6.2500 mg | INTRAMUSCULAR | Status: DC | PRN

## 2019-07-01 MED ORDER — DIBUCAINE (PERIANAL) 1 % EX OINT: 1.0000 "application " | TOPICAL_OINTMENT | CUTANEOUS | Status: DC | PRN

## 2019-07-01 MED ORDER — PHENYLEPHRINE 40 MCG/ML (10ML) SYRINGE FOR IV PUSH (FOR BLOOD PRESSURE SUPPORT)
PREFILLED_SYRINGE | INTRAVENOUS | Status: AC
  Filled 2019-07-01: qty 10

## 2019-07-01 MED ORDER — HYDROMORPHONE HCL 1 MG/ML IJ SOLN
0.2500 mg | INTRAMUSCULAR | Status: DC | PRN
  Administered 2019-07-01 (×2): 0.5 mg via INTRAVENOUS

## 2019-07-01 MED ORDER — ACETAMINOPHEN 500 MG PO TABS
1000.0000 mg | ORAL_TABLET | Freq: Four times a day (QID) | ORAL | Status: DC | PRN
  Administered 2019-07-02: 19:00:00 1000 mg via ORAL
  Filled 2019-07-01: qty 2

## 2019-07-01 MED ORDER — ONDANSETRON HCL 4 MG/2ML IJ SOLN
INTRAMUSCULAR | Status: DC | PRN
  Administered 2019-07-01: 4 mg via INTRAVENOUS

## 2019-07-01 MED ORDER — PHENYLEPHRINE HCL-NACL 20-0.9 MG/250ML-% IV SOLN
INTRAVENOUS | Status: AC
  Filled 2019-07-01: qty 250

## 2019-07-01 MED ORDER — SODIUM CHLORIDE 0.9% FLUSH: 3.0000 mL | INTRAVENOUS | Status: DC | PRN

## 2019-07-01 MED ORDER — CEFAZOLIN SODIUM-DEXTROSE 2-3 GM-%(50ML) IV SOLR
INTRAVENOUS | Status: DC | PRN
  Administered 2019-07-01: 2 g via INTRAVENOUS

## 2019-07-01 MED ORDER — BUPIVACAINE IN DEXTROSE 0.75-8.25 % IT SOLN
INTRATHECAL | Status: DC | PRN
  Administered 2019-07-01: 1.6 mL via INTRATHECAL

## 2019-07-01 MED ORDER — FENTANYL CITRATE (PF) 100 MCG/2ML IJ SOLN
INTRAMUSCULAR | Status: DC | PRN
  Administered 2019-07-01: 85 ug via INTRAVENOUS
  Administered 2019-07-01: 15 ug via INTRATHECAL

## 2019-07-01 MED ORDER — FLUTICASONE PROPIONATE 50 MCG/ACT NA SUSP: 2.0000 | Freq: Every day | NASAL | Status: DC

## 2019-07-01 MED ORDER — OXYCODONE HCL 5 MG PO TABS
5.0000 mg | ORAL_TABLET | ORAL | Status: DC | PRN
  Administered 2019-07-02: 10 mg via ORAL
  Administered 2019-07-03: 5 mg via ORAL
  Filled 2019-07-01: qty 2
  Filled 2019-07-01: qty 1

## 2019-07-01 MED ORDER — FENTANYL CITRATE (PF) 100 MCG/2ML IJ SOLN
INTRAMUSCULAR | Status: AC
  Filled 2019-07-01: qty 2

## 2019-07-01 MED ORDER — MENTHOL 3 MG MT LOZG
1.0000 | LOZENGE | OROMUCOSAL | Status: DC | PRN
  Filled 2019-07-01: qty 9

## 2019-07-01 MED ORDER — TETANUS-DIPHTH-ACELL PERTUSSIS 5-2.5-18.5 LF-MCG/0.5 IM SUSP: 0.5000 mL | Freq: Once | INTRAMUSCULAR | Status: DC

## 2019-07-01 MED ORDER — MORPHINE SULFATE (PF) 0.5 MG/ML IJ SOLN
INTRAMUSCULAR | Status: DC | PRN
  Administered 2019-07-01: 150 ug via INTRATHECAL

## 2019-07-01 MED ORDER — SCOPOLAMINE 1 MG/3DAYS TD PT72
MEDICATED_PATCH | TRANSDERMAL | Status: AC
  Filled 2019-07-01: qty 1

## 2019-07-01 MED ORDER — PRENATAL MULTIVITAMIN CH
1.0000 | ORAL_TABLET | Freq: Every day | ORAL | Status: DC
  Administered 2019-07-02: 13:00:00 1 via ORAL
  Filled 2019-07-01 (×2): qty 1

## 2019-07-01 MED ORDER — KETOROLAC TROMETHAMINE 30 MG/ML IJ SOLN: 30.0000 mg | Freq: Once | INTRAMUSCULAR | Status: DC | PRN

## 2019-07-01 MED ORDER — ONDANSETRON HCL 4 MG/2ML IJ SOLN
4.0000 mg | Freq: Three times a day (TID) | INTRAMUSCULAR | Status: DC | PRN
  Administered 2019-07-01: 21:00:00 4 mg via INTRAVENOUS

## 2019-07-01 MED ORDER — OXYTOCIN 40 UNITS IN NORMAL SALINE INFUSION - SIMPLE MED: 2.5000 [IU]/h | INTRAVENOUS | Status: DC

## 2019-07-01 MED ORDER — COCONUT OIL OIL: 1.0000 "application " | TOPICAL_OIL | Status: DC | PRN

## 2019-07-01 SURGICAL SUPPLY — 42 items
BARRIER ADHS 3X4 INTERCEED (GAUZE/BANDAGES/DRESSINGS) ×2 IMPLANT
BENZOIN TINCTURE PRP APPL 2/3 (GAUZE/BANDAGES/DRESSINGS) ×1 IMPLANT
CHLORAPREP W/TINT 26ML (MISCELLANEOUS) ×2 IMPLANT
CLAMP CORD UMBIL (MISCELLANEOUS) IMPLANT
CLOTH BEACON ORANGE TIMEOUT ST (SAFETY) ×2 IMPLANT
DRAPE C SECTION CLR SCREEN (DRAPES) ×2 IMPLANT
DRSG OPSITE POSTOP 4X10 (GAUZE/BANDAGES/DRESSINGS) ×2 IMPLANT
ELECT REM PT RETURN 9FT ADLT (ELECTROSURGICAL) ×2
ELECTRODE REM PT RTRN 9FT ADLT (ELECTROSURGICAL) ×1 IMPLANT
EXTRACTOR VACUUM M CUP 4 TUBE (SUCTIONS) IMPLANT
GLOVE BIOGEL PI IND STRL 7.0 (GLOVE) ×2 IMPLANT
GLOVE BIOGEL PI INDICATOR 7.0 (GLOVE) ×2
GLOVE ECLIPSE 6.5 STRL STRAW (GLOVE) ×2 IMPLANT
GOWN STRL REUS W/TWL LRG LVL3 (GOWN DISPOSABLE) ×4 IMPLANT
KIT ABG SYR 3ML LUER SLIP (SYRINGE) IMPLANT
NDL HYPO 25X5/8 SAFETYGLIDE (NEEDLE) IMPLANT
NEEDLE HYPO 22GX1.5 SAFETY (NEEDLE) ×2 IMPLANT
NEEDLE HYPO 25X5/8 SAFETYGLIDE (NEEDLE) IMPLANT
NS IRRIG 1000ML POUR BTL (IV SOLUTION) ×2 IMPLANT
PACK C SECTION WH (CUSTOM PROCEDURE TRAY) ×2 IMPLANT
PAD OB MATERNITY 4.3X12.25 (PERSONAL CARE ITEMS) ×2 IMPLANT
RTRCTR C-SECT PINK 25CM LRG (MISCELLANEOUS) IMPLANT
STRIP CLOSURE SKIN 1/2X4 (GAUZE/BANDAGES/DRESSINGS) ×1 IMPLANT
SUT CHROMIC GUT AB #0 18 (SUTURE) IMPLANT
SUT MNCRL 0 VIOLET CTX 36 (SUTURE) ×3 IMPLANT
SUT MON AB 2-0 SH 27 (SUTURE)
SUT MON AB 2-0 SH27 (SUTURE) IMPLANT
SUT MON AB 3-0 SH 27 (SUTURE)
SUT MON AB 3-0 SH27 (SUTURE) IMPLANT
SUT MON AB 4-0 PS1 27 (SUTURE) IMPLANT
SUT MONOCRYL 0 CTX 36 (SUTURE) ×3
SUT PLAIN 2 0 (SUTURE) ×1
SUT PLAIN 2 0 XLH (SUTURE) IMPLANT
SUT PLAIN ABS 2-0 CT1 27XMFL (SUTURE) IMPLANT
SUT VIC AB 0 CT1 36 (SUTURE) ×4 IMPLANT
SUT VIC AB 2-0 CT1 27 (SUTURE) ×1
SUT VIC AB 2-0 CT1 TAPERPNT 27 (SUTURE) ×1 IMPLANT
SUT VIC AB 4-0 PS2 27 (SUTURE) IMPLANT
SYR CONTROL 10ML LL (SYRINGE) ×2 IMPLANT
TOWEL OR 17X24 6PK STRL BLUE (TOWEL DISPOSABLE) ×2 IMPLANT
TRAY FOLEY W/BAG SLVR 14FR LF (SET/KITS/TRAYS/PACK) IMPLANT
WATER STERILE IRR 1000ML POUR (IV SOLUTION) ×2 IMPLANT

## 2019-07-01 NOTE — MAU Note (Signed)
Water broke 1145, clear fluid still.. baby is breech.  Little pain.  No bleeding.

## 2019-07-01 NOTE — Anesthesia Postprocedure Evaluation (Signed)
Anesthesia Post Note  Patient: Sophia Oneill  Procedure(s) Performed: Primary CESAREAN SECTION (N/A )     Patient location during evaluation: PACU Anesthesia Type: Spinal Level of consciousness: oriented and awake and alert Pain management: pain level controlled Vital Signs Assessment: post-procedure vital signs reviewed and stable Respiratory status: spontaneous breathing and respiratory function stable Cardiovascular status: blood pressure returned to baseline and stable Postop Assessment: no headache, no backache and no apparent nausea or vomiting Anesthetic complications: no    Last Vitals:  Vitals:   07/01/19 2045 07/01/19 2100  BP: 140/90 (!) 160/80  Pulse: 93 (!) 113  Resp: (!) 22 (!) 23  Temp:    SpO2: 100% 100%    Last Pain:  Vitals:   07/01/19 2115  TempSrc:   PainSc: (P) 0-No pain   Pain Goal:    LLE Motor Response: (P) Non-purposeful movement (07/01/19 2100) LLE Sensation: (P) Tingling (07/01/19 2100) RLE Motor Response: (P) Non-purposeful movement (07/01/19 2100) RLE Sensation: (P) Tingling (07/01/19 2100)     Epidural/Spinal Function Cutaneous sensation: (P) Able to Wiggle Toes (07/01/19 2100), Patient able to flex knees: (P) No (07/01/19 2100), Patient able to lift hips off bed: (P) No (07/01/19 2100), Back pain beyond tenderness at insertion site: (P) No (07/01/19 2100), Progressively worsening motor and/or sensory loss: (P) No (07/01/19 2100), Bowel and/or bladder incontinence post epidural: (P) No (07/01/19 2100)  Lynda Rainwater

## 2019-07-01 NOTE — Anesthesia Procedure Notes (Signed)
Spinal  Patient location during procedure: OB Start time: 07/01/2019 6:27 PM End time: 07/01/2019 6:32 PM Staffing Anesthesiologist: Lynda Rainwater, MD Performed: anesthesiologist  Preanesthetic Checklist Completed: patient identified, surgical consent, pre-op evaluation, timeout performed, IV checked, risks and benefits discussed and monitors and equipment checked Spinal Block Patient position: sitting Prep: site prepped and draped and DuraPrep Patient monitoring: heart rate, cardiac monitor, continuous pulse ox and blood pressure Approach: midline Location: L3-4 Injection technique: single-shot Needle Needle type: Pencan  Needle gauge: 24 G Needle length: 10 cm Assessment Sensory level: T4

## 2019-07-01 NOTE — Anesthesia Preprocedure Evaluation (Signed)
Anesthesia Evaluation  Patient identified by MRN, date of birth, ID band Patient awake    Reviewed: Allergy & Precautions, H&P , NPO status , Patient's Chart, lab work & pertinent test results  Airway Mallampati: II  TM Distance: >3 FB Neck ROM: Full    Dental no notable dental hx. (+) Teeth Intact   Pulmonary neg pulmonary ROS,    Pulmonary exam normal breath sounds clear to auscultation       Cardiovascular negative cardio ROS Normal cardiovascular exam Rhythm:Regular Rate:Normal     Neuro/Psych Depression negative neurological ROS  negative psych ROS   GI/Hepatic Neg liver ROS, GERD  ,  Endo/Other  negative endocrine ROSdiabetesObesity PCOS  Renal/GU negative Renal ROS  negative genitourinary   Musculoskeletal negative musculoskeletal ROS (+)   Abdominal (+) + obese,   Peds negative pediatric ROS (+)  Hematology negative hematology ROS (+)   Anesthesia Other Findings   Reproductive/Obstetrics negative OB ROS (+) Pregnancy                             Anesthesia Physical  Anesthesia Plan  ASA: II  Anesthesia Plan: Spinal   Post-op Pain Management:    Induction:   PONV Risk Score and Plan: Treatment may vary due to age or medical condition  Airway Management Planned: Natural Airway  Additional Equipment:   Intra-op Plan:   Post-operative Plan:   Informed Consent: I have reviewed the patients History and Physical, chart, labs and discussed the procedure including the risks, benefits and alternatives for the proposed anesthesia with the patient or authorized representative who has indicated his/her understanding and acceptance.       Plan Discussed with: Anesthesiologist  Anesthesia Plan Comments:         Anesthesia Quick Evaluation

## 2019-07-01 NOTE — MAU Provider Note (Signed)
Chief Complaint  Patient presents with  . Rupture of Membranes     First Provider Initiated Contact with Patient 07/01/19 1309      S: Sophia Oneill  is a 33 y.o. y.o. year old G80P1011 female at [redacted]w[redacted]d weeks gestation who presents to MAU with leaking of clear fluid since 1145 and incidentally found to have elevated blood pressures. Hx elevated BP at last prenatal visit. Current blood pressure medication: None.   Associated symptoms: Denies Headache, vision changes, epigastric pain Contractions: Denies Vaginal bleeding: Denies Fetal movement: Nml  Breech at last PNV 1 week ago. Failed ECV. Scheduled for C/S.    Dr. Benjie Karvonen is her primary. Last food and drink ~0700.   O:  Patient Vitals for the past 24 hrs:  BP Temp Temp src Pulse Resp SpO2 Height Weight  07/01/19 1445 (!) 144/86 - - (!) 102 - - - -  07/01/19 1430 (!) 144/96 - - (!) 102 - - - -  07/01/19 1415 (!) 144/90 - - (!) 109 - - - -  07/01/19 1400 (!) 139/95 - - (!) 102 - - - -  07/01/19 1349 (!) 142/102 - - (!) 108 - - - -  07/01/19 1330 (!) 144/88 - - (!) 103 - - - -  07/01/19 1315 (!) 159/103 - - (!) 110 - - - -  07/01/19 1300 (!) 145/106 - - (!) 109 - - - -  07/01/19 1253 (!) 152/95 - - (!) 112 - - - -  07/01/19 1236 (!) 150/99 98.5 F (36.9 C) Oral (!) 115 18 100 % 5\' 3"  (1.6 m) 96.6 kg   General: NAD Heart: Regular rate Lungs: Normal rate and effort Abd: Soft, NT, Gravid, S=D Extremities: No Pedal edema Neuro: 2+ deep tendon reflexes, No clonus Pelvic: NEFG, scant pooling, no bleeding.   Dilation: 1.5 Effacement (%): 50 Presentation: Complete Breech(per bedside U/S) Exam by:: V.Melinda Pottinger,CNM  EFM: 135, Moderate variability, 15 x 15 accelerations, no decelerations Toco: Rare, mild  Results for orders placed or performed during the hospital encounter of 07/01/19 (from the past 24 hour(s))  CBC     Status: Abnormal   Collection Time: 07/01/19  1:43 PM  Result Value Ref Range   WBC 12.8 (H) 4.0 - 10.5 K/uL    RBC 5.06 3.87 - 5.11 MIL/uL   Hemoglobin 13.4 12.0 - 15.0 g/dL   HCT 40.7 36.0 - 46.0 %   MCV 80.4 80.0 - 100.0 fL   MCH 26.5 26.0 - 34.0 pg   MCHC 32.9 30.0 - 36.0 g/dL   RDW 14.6 11.5 - 15.5 %   Platelets 322 150 - 400 K/uL   nRBC 0.0 0.0 - 0.2 %  Comprehensive metabolic panel     Status: Abnormal   Collection Time: 07/01/19  1:43 PM  Result Value Ref Range   Sodium 137 135 - 145 mmol/L   Potassium 4.0 3.5 - 5.1 mmol/L   Chloride 106 98 - 111 mmol/L   CO2 20 (L) 22 - 32 mmol/L   Glucose, Bld 90 70 - 99 mg/dL   BUN 7 6 - 20 mg/dL   Creatinine, Ser 0.82 0.44 - 1.00 mg/dL   Calcium 9.3 8.9 - 10.3 mg/dL   Total Protein 7.0 6.5 - 8.1 g/dL   Albumin 3.0 (L) 3.5 - 5.0 g/dL   AST 13 (L) 15 - 41 U/L   ALT 13 0 - 44 U/L   Alkaline Phosphatase 137 (H) 38 - 126 U/L  Total Bilirubin 0.7 0.3 - 1.2 mg/dL   GFR calc non Af Amer >60 >60 mL/min   GFR calc Af Amer >60 >60 mL/min   Anion gap 11 5 - 15  Urinalysis, Routine w reflex microscopic     Status: Abnormal   Collection Time: 07/01/19  1:48 PM  Result Value Ref Range   Color, Urine YELLOW YELLOW   APPearance HAZY (A) CLEAR   Specific Gravity, Urine 1.012 1.005 - 1.030   pH 6.0 5.0 - 8.0   Glucose, UA NEGATIVE NEGATIVE mg/dL   Hgb urine dipstick NEGATIVE NEGATIVE   Bilirubin Urine NEGATIVE NEGATIVE   Ketones, ur NEGATIVE NEGATIVE mg/dL   Protein, ur NEGATIVE NEGATIVE mg/dL   Nitrite NEGATIVE NEGATIVE   Leukocytes,Ua NEGATIVE NEGATIVE  Protein / creatinine ratio, urine     Status: None   Collection Time: 07/01/19  1:48 PM  Result Value Ref Range   Creatinine, Urine 109.36 mg/dL   Total Protein, Urine 10 mg/dL   Protein Creatinine Ratio 0.09 0.00 - 0.15 mg/mg[Cre]  Amnisure rupture of membrane (rom)not [facility]     Status: None   Collection Time: 07/01/19  2:19 PM  Result Value Ref Range   Amnisure ROM POSITIVE   Type and screen     Status: None (Preliminary result)   Collection Time: 07/01/19  3:46 PM  Result Value Ref Range    ABO/RH(D) O NEG    Antibody Screen PENDING    Sample Expiration      07/04/2019,2359 Performed at [facility], [address] [place], [place] [address]     A: 3975w4d week IUP 1. Leakage of amniotic fluid   2. Gestational hypertension, third trimester   3. Breech presentation, single or unspecified fetus   FHR reactive GBS neg Maternal tachycardia, improving, asymptomatic.   P: Prep for OR per consult with Dr. Cherly Hensenousins. NPO  Katrinka BlazingSmith, [place], [place] 07/01/2019 4:15 PM

## 2019-07-01 NOTE — Transfer of Care (Signed)
Immediate Anesthesia Transfer of Care Note  Patient: Sophia Oneill  Procedure(s) Performed: Primary CESAREAN SECTION (N/A )  Patient Location: PACU  Anesthesia Type:Spinal  Level of Consciousness: awake and alert   Airway & Oxygen Therapy: Patient Spontanous Breathing  Post-op Assessment: Report given to RN and Post -op Vital signs reviewed and stable  Post vital signs: Reviewed and stable  Last Vitals:  Vitals Value Taken Time  BP    Temp    Pulse 80 07/01/19 1946  Resp 16 07/01/19 1946  SpO2 100 % 07/01/19 1946  Vitals shown include unvalidated device data.  Last Pain:  Vitals:   07/01/19 1236  TempSrc: Oral         Complications: No apparent anesthesia complications

## 2019-07-01 NOTE — H&P (Signed)
Sophia Oneill is a 33 y.o. female presenting at 38.4 wks with SROM at 11 am. Persistent breech, failed version.  AGA.  A1GDM, well controlled, no meds Prior term SVD 5'14"   OB History    Gravida  3   Para  1   Term  1   Preterm      AB  1   Living  1     SAB  1   TAB      Ectopic      Multiple      Live Births  1          Past Medical History:  Diagnosis Date  . Depression   . GDM (gestational diabetes mellitus) 04/2019  . GERD (gastroesophageal reflux disease)   . Gestational diabetes   . PCOS (polycystic ovarian syndrome) 02/2013  . Tachycardia    Past Surgical History:  Procedure Laterality Date  . WISDOM TOOTH EXTRACTION     Family History: family history includes Birth defects in her sister; Diabetes in her father and sister; Heart disease in her maternal grandfather; Hypertension in her father and mother. Social History:  reports that she has never smoked. She has never used smokeless tobacco. She reports that she does not drink alcohol or use drugs.     Maternal Diabetes: Yes:  Diabetes Type:  Diet controlled Genetic Screening: Normal Maternal Ultrasounds/Referrals: Normal Fetal Ultrasounds or other Referrals:  None Maternal Substance Abuse:  No Significant Maternal Medications:  Meds include: Other: Lansoprazole, Zofran Significant Maternal Lab Results:  Group B Strep negative and Rh negative Other Comments:  None  ROS History Dilation: 1.5 Effacement (%): 50 Exam by:: V.Smith,CNM Blood pressure (!) 143/84, pulse 99, temperature 98.5 F (36.9 C), temperature source Oral, resp. rate 18, height 5\' 3"  (1.6 m), weight 96.6 kg, SpO2 100 %. Exam Physical Exam   A&O x 3, no acute distress. Pleasant HEENT neg, no thyromegaly Lungs CTA bilat CV RRR, S1S2 normal Abdo soft, non tender, non acute, no UCs. Breech by bedside sono  Extr no edema/ tenderness Pelvic above FHT 140s + accels no decels mod variab- I Toco none  Prenatal  labs: ABO, Rh: --/--/O NEG (07/07 1546) Antibody: POS (07/07 1546) Rubella: Immune (01/07 0000) RPR: Nonreactive (01/07 0000)  HBsAg: Negative (01/07 0000)  HIV: Non-reactive (01/07 0000)  GBS:   neg (6/24)  Assessment/Plan: 38.4 wks, Breech. SROM. Here for Primary c-section    Risks/complications of surgery reviewed incl infection, bleeding, damage to internal organs including bladder, bowels, ureters, blood vessels, other risks from anesthesia, VTE and delayed complications of any surgery, complications in future surgery reviewed. Also discussed neonatal complications incl difficult delivery, laceration, vacuum assistance, TTN etc. Pt understands and agrees, all concerns addressed.    Elveria Royals 07/01/2019, 5:59 PM

## 2019-07-01 NOTE — Op Note (Signed)
Cesarean Section Procedure Note   Sophia Oneill  07/01/2019  Indications: Breech Presentation SROM 38.4 wks   Pre-operative Diagnosis: breech presentation. SROM  Post-operative Diagnosis: Same   Surgeon: Surgeon(s) and Role:    * Azucena Fallen, MD - Primary   Assistants: Lars Pinks, CNM  Anesthesia: spinal   Procedure Details:  The patient was seen in the Holding Room. The risks, benefits, complications, treatment options, and expected outcomes were discussed with the patient. The patient concurred with the proposed plan, giving informed consent. identified as Sophia Oneill and the procedure verified as C-Section Delivery. A Time Out was held and the above information confirmed. 2 gm Ancef given.  After induction of anesthesia, the patient was draped and prepped in the usual sterile manner, foley was draining urine well.  A pfannenstiel incision was made and carried down through the subcutaneous tissue to the fascia. Fascial incision was made and extended transversely. The fascia was separated from the underlying rectus tissue superiorly and inferiorly. The peritoneum was identified and entered. Peritoneal incision was extended longitudinally. Alexis-O retractor placed. The utero-vesical peritoneal reflection was incised transversely and the bladder flap was bluntly freed from the lower uterine segment. A low transverse uterine incision was made. Delivered from [place] presentation by complete breech extraction was a Female infant with vigorous cry. Apgar scores of 8 at one minute and 8 at five minutes and 10 at 10 minutes. Delayed cord clamping done at 1 minute and baby handed to NICU team in attendance. Cord ph was not sent. Cord blood was obtained for evaluation. The placenta was removed Intact and appeared normal. The uterine outline, tubes and ovaries appeared normal. The uterine incision was closed with running locked sutures of 0Monocryl. A second imbricating layer  sutured.   Hemostasis was observed. Alexis retractor removed. Peritoneal closure done with 2-0 Vicryl.  The fascia was then reapproximated with running sutures of 0Vicryl. The subcuticular closure was performed using 2-0plain gut. The skin was closed with 4-0Vicryl. Sterile dressings placed.   Instrument, sponge, and needle counts were correct prior the abdominal closure and were correct at the conclusion of the case.    Findings:Female infant, Pilar Plate breech. Delivery from [facility] hysterotomy by complete breech extraction without difficulty. Apgars 8, 8, 10 at 1, 5, 10 minutes. Normal uterus, tubes, ovaries. 3 vessel cord, normal placenta    Estimated Blood Loss: 289 mL   Total IV Fluids: 1222ml LR   Urine Output: 100CC OF clear urine  Specimens: cord blood    Complications: no complications  Disposition: PACU - hemodynamically stable.   Maternal Condition: stable   Baby condition / location:  Couplet care / Skin to Skin  Attending Attestation: I performed the procedure.   Signed: Surgeon(s): Azucena Fallen, MD

## 2019-07-02 ENCOUNTER — Encounter (HOSPITAL_COMMUNITY): Payer: Self-pay | Admitting: *Deleted

## 2019-07-02 LAB — CREATININE, SERUM
Creatinine, Ser: 0.74 mg/dL (ref 0.44–1.00)
GFR calc Af Amer: >60 mL/min (ref >60)
GFR calc non Af Amer: >60 mL/min (ref >60)

## 2019-07-02 LAB — CBC
HCT: 31.9 % — ABNORMAL LOW (ref 36.0–46.0)
Hemoglobin: 10.5 g/dL — ABNORMAL LOW (ref 12.0–15.0)
MCH: 26.5 pg (ref 26.0–34.0)
MCHC: 32.9 g/dL (ref 30.0–36.0)
MCV: 80.6 fL (ref 80.0–100.0)
Platelets: 272 10*3/uL (ref 150–400)
RBC: 3.96 MIL/uL (ref 3.87–5.11)
RDW: 14.4 % (ref 11.5–15.5)
WBC: 15.1 10*3/uL — ABNORMAL HIGH (ref 4.0–10.5)
nRBC: 0 % (ref 0.0–0.2)

## 2019-07-02 LAB — RPR: RPR Ser Ql: NONREACTIVE

## 2019-07-02 MED ORDER — POLYSACCHARIDE IRON COMPLEX 150 MG PO CAPS
150.0000 mg | ORAL_CAPSULE | Freq: Every day | ORAL | Status: DC
  Administered 2019-07-03: 150 mg via ORAL
  Filled 2019-07-02: qty 1

## 2019-07-02 MED ORDER — RHO D IMMUNE GLOBULIN 1500 UNIT/2ML IJ SOSY
300.0000 ug | PREFILLED_SYRINGE | Freq: Once | INTRAMUSCULAR | Status: AC
  Administered 2019-07-02: 10:00:00 300 ug via INTRAVENOUS
  Filled 2019-07-02: qty 2

## 2019-07-02 MED ORDER — MAGNESIUM OXIDE 400 (241.3 MG) MG PO TABS
400.0000 mg | ORAL_TABLET | Freq: Every day | ORAL | Status: DC
  Administered 2019-07-02 – 2019-07-03 (×2): 400 mg via ORAL
  Filled 2019-07-02 (×2): qty 1

## 2019-07-02 NOTE — Progress Notes (Addendum)
POSTOPERATIVE DAY # 1 S/P CS - breech  Subjective:  reports feeling good - tired without a lot of sleep with baby feedings tolerating po intake / no nausea / no vomiting / no flatus / no BM pain controlled  Newborn Breast / supplementation with syringe between feedings  Objective: VS: BP 138/82 (BP Location: Right Arm)   Pulse 73   Temp 98.3 F (36.8 C) (Oral)   Resp 18   Ht 5\' 3"  (1.6 m)   Wt 96.6 kg   SpO2 98%   BMI 37.73 kg/m        LABS:  Recent Labs    07/01/19 1343 07/02/19 0508  WBC 12.8* 15.1*  HGB 13.4 10.5*  PLT 322 272    Bloodtype: --/--/O NEG (07/08 0508) / newborn RH B pos - rhogam ordered  Rubella: Immune (01/07 0000)                                           I&O: Intake/Output      07/07 0701 - 07/08 0700 07/08 0701 - 07/09 0700   I.V. (mL/kg) 1400 (14.5)    Total Intake(mL/kg) 1400 (14.5)    Urine (mL/kg/hr) 750    Blood 289    Total Output 1039    Net +361          Physical Exam: alert and oriented X3 without any distress or pain lung fields are clear and unlabored heart rate regular / normal rhythm / no mumurs abdomen soft, non-tender, mildly distended / panus with trace edema in lower pole  bowel sounds are hypoactive uterine fundus firm, non-tender, Ueven surgical incision assessed with dressing intact without drainage  lochia: scant to light extremities: trace pedal edema, no calf pain or tenderness, SCD in place  Assessment / Plan:          POD # 1 S/P CS - breech Mild ABL anemia - start mag ox and iron tomorrow RH negative / newborn RH  - rhogam ordered routine postoperative care   Artelia Laroche CNM, MSN, [facility] 07/02/2019, 7:55 AM   ADDENDUM: Lovenox prophylaxis initiated this AM - Motrin scheduled for ONLY 72 hour course per orders

## 2019-07-02 NOTE — Lactation Note (Signed)
This note was copied from a baby's chart. Lactation Consultation Note  Patient Name: Sophia Oneill [id number] Date: 07/02/2019 Reason for consult: Initial assessment;Early term 37-38.6wks;Infant < 6lbs P2, 10 hour female infant, ETI , Mom with GDM and C/S delivery. 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 Per dad, infant spit up (emesis) with  Similac 20 kcal with iron when given earlier last night.  Per mom, infant did not latch during L&D.  Mom latched infant on right breast using the foot ball hold position, infant open mouth wide and breastfeed for 5 minutes then stopped easily tired. Per mom, this was infant's first time latching to breast. Mom hand expressed and infant was given 2 ml of colostrum (EBM) by spoon and infant was supplemented with given 3 ml of Gerber with iron by curve tip syringe which infant did not spit up. LC discussed breastfeeding and supplementing with EBM/ and or formula after feeding infant at breast, guidelines given based on infant's age and hours of life. Mom knows to breastfeed according hunger cues, 8 to 12 times within 24 hours and on demand. Parents will continue to do as much STS as possible.  Mom knows to ask Nurse or [place] if she has any questions, concerns or need assistance with latching infant to breast. Mom plans to use DEBP every 3 hours for 15 minutes. Mom shown how to use DEBP & how to disassemble, clean, & reassemble parts. Mom made aware of O/P services, breastfeeding support groups, community resources, and our phone # for post-discharge questions.  Mom's plans:  1. Mom will breastfeed according hunger cues, 8 to 12 times within 24 hours and on demand. 2. Mom will hand expresses and pump to give infant back EBM after latching infant to breast and then supplement with  formula based on infant's age/ hours of life. 3. Mom plans to pump every 3 hours for 15 minutes on initial setting.  Maternal Data Formula Feeding for Exclusion: No Has patient been taught Hand Expression?: Yes(Infant given 2 ml of colostrum by spoon.) Does the patient have breastfeeding experience prior to this delivery?: No  Feeding Feeding Type: Breast Fed  LATCH Score Latch: Grasps breast easily, tongue down, lips flanged, rhythmical sucking.  Audible Swallowing: A few with stimulation  Type of Nipple: Everted at rest and after stimulation  Comfort (Breast/Nipple): Soft / non-tender  Hold (Positioning): Assistance needed to correctly position infant at breast and maintain latch.  LATCH Score: 8  Interventions Interventions: Breast feeding basics reviewed;Breast compression;Adjust position;Assisted with latch;Skin to skin;Support pillows;DEBP;Position options;Breast massage;Hand express;Expressed milk;Hand pump  Lactation Tools Discussed/Used Tools: Pump Breast pump type: Double-Electric Breast Pump WIC Program: No Pump Review: Setup, frequency, and cleaning;Milk Storage Initiated by:: Vicente Serene, IBCLC Date initiated:: 07/02/19   Consult Status Consult Status: Follow-up Date: 07/02/19 Follow-up type: In-patient    Vicente Serene 07/02/2019, 6:12 AM

## 2019-07-02 NOTE — Progress Notes (Signed)
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  CSW met with MOB to offer support and complete assessment.    MOB sitting up in bed doing skin-to-skin with infant, when CSW entered the room. FOB also present at bedside. CSW introduced self and received verbal permission from MOB to complete assessment with FOB present. CSW explained reason for consult and MOB expressed understanding. 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 Per MOB, she noted some depression in the earlier part of this year that she attributes to her job and worrying about her baby. MOB reported she started counseling through work, felt it was effective and no longer feels she needs it. MOB shared she experienced some PPD with her 29-year-old son and described symptoms of wanting to isolate and some moodiness. MOB reported symptoms lasted for about 3 or 4 months and shared she was started on Zoloft. MOB stated she is not currently taking Zoloft but feels comfortable reaching out in the event she feels she needs it. MOB explained that during her last pregnancy, FOB had just started a new job and wasn't able to be home with her and the baby but that he is now able to take a couple of months off.   CSW provided education regarding the baby blues period vs. perinatal mood disorders, discussed treatment and gave resources for mental health follow up if concerns arise.  CSW recommends self-evaluation during the postpartum time period using the New Mom Checklist from Postpartum Progress and encouraged MOB to contact a medical professional if symptoms are noted at any time. MOB denied any current SI or HI and reported she was feeling "better".    MOB confirmed having all essential items for infant once discharged and stated infant would be sleeping in a crib once home. CSW provided review of Sudden Infant Death Syndrome (SIDS) precautions.    CSW identifies no further need for  intervention and no barriers to discharge at this time.  Sophia Oneill, [place]  [facility] [employer] [phone]

## 2019-07-02 NOTE — Lactation Note (Signed)
This note was copied from a baby's chart. Lactation Consultation Note  Patient Name: Sophia Oneill BHALP'F Date: 07/02/2019 Reason for consult: Follow-up assessment;Infant < 6lbs;Early term 37-38.6wks  1329 - 1337 - I followed up with Sophia Oneill. She states that she's doing well with breast feeding thus far. She feels comfortable with latching her daughter, Sophia Oneill, to the breast. Sophia Oneill typically feeds about 5 minutes and then releases the breast, per mom. Then mom pumps for 15 minutes on the initiation setting while her support person supplements baby.  We reviewed purposed of pumping and when to expect mature milk to transition. I also encouraged Sophia Oneill to hand express and spoon feed colostrum as she may be able to express a bit more by hand than the pump can extract. She states that she has been able to do some expression and spoon feeding.  I reviewed feeding cues and day 1 and day 2 infant feeding patterns including cluster feeding. I encouraged Sophia Oneill to try to burp Sophia Oneill and put her back on the breast if she falls asleep after about 5 minutes on the breast. She is to continue with the plan she and Sophia Oneill developed at the first visit.   Sophia Oneill did not have any additional questions at this time. I gave her my name and encouraged her to call as needed.   Maternal Data Has patient been taught Hand Expression?: Yes  Feeding Feeding Type: Breast Fed   Interventions Interventions: Breast feeding basics reviewed(review of plan)  Lactation Tools Discussed/Used  Spoon   Consult Status Consult Status: Follow-up Date: 07/03/19 Follow-up type: In-patient    Lenore Manner 07/02/2019, 1:38 PM

## 2019-07-03 LAB — RH IG WORKUP (INCLUDES ABO/RH)
ABO/RH(D): O NEG
Fetal Screen: NEGATIVE
Gestational Age(Wks): 38.4
Unit division: 0

## 2019-07-03 MED ORDER — SENNOSIDES-DOCUSATE SODIUM 8.6-50 MG PO TABS: 2.0000 | ORAL_TABLET | Freq: Every evening | ORAL | 0 refills | Status: DC | PRN

## 2019-07-03 MED ORDER — IBUPROFEN 800 MG PO TABS: 800.0000 mg | ORAL_TABLET | Freq: Three times a day (TID) | ORAL | 0 refills | Status: DC

## 2019-07-03 MED ORDER — OXYCODONE HCL 5 MG PO TABS: 5.0000 mg | ORAL_TABLET | ORAL | 0 refills | Status: DC | PRN

## 2019-07-03 MED ORDER — POLYSACCHARIDE IRON COMPLEX 150 MG PO CAPS: 150.0000 mg | ORAL_CAPSULE | Freq: Every day | ORAL | 3 refills | Status: AC

## 2019-07-03 NOTE — Discharge Summary (Signed)
Obstetric Discharge Summary   Patient Name: Sophia Oneill DOB: [birth year] MRN: [id number]  Date of Admission: 07/01/2019 Date of Discharge: 07/03/2019 Date of Delivery: 07/01/2019 Gestational Age at Delivery: [redacted]w[redacted]d  Primary OB: [facility] - Dr. Benjie Karvonen  Antepartum complications:  - Obesity - A1GDM - Breech presentation - Hx. Of PP depression - Hx. Of asthma  Prenatal Labs:  ABO, Rh: --/--/O NEG (07/07 1546) Antibody: POS (07/07 1546) Rubella: Immune (01/07 0000) RPR: Nonreactive (01/07 0000)  HBsAg: Negative (01/07 0000)  HIV: Non-reactive (01/07 0000)  GBS:   neg (6/24) Admitting Diagnosis: 38+4 weeks breech, SROM for primary LTCS  Secondary Diagnoses: Patient Active Problem List   Diagnosis Date Noted  . Breech presentation 07/01/2019  . Postpartum care following cesarean delivery (7/7) 07/01/2019  . S/P cesarean section 07/01/2019  . Frank breech presentation 06/24/2019  . GDM, class A1 06/24/2019  . Frank breech 06/24/2019  . Bacterial vaginosis 06/07/2019  . Pelvic pressure in pregnancy, antepartum, third trimester 06/07/2019  . PCOS (polycystic ovarian syndrome) 11/04/2013    Date of Delivery: 07/01/2019 Delivered By: Dr. Benjie Karvonen M. Sigmon CNM assist Delivery Type: primary cesarean section, low transverse incision Anesthesia: spinal  Newborn Data: Live born female  Birth Weight: 5 lb 13.8 oz (2660 g) APGAR: 8, 8  Newborn Delivery   Birth date/time: 07/01/2019 18:54:00 Delivery type: C-Section, Low Transverse Trial of labor: No C-section categorization: Repeat         Hospital/Postpartum Course  (Cesarean Section):  Pt. Admitted for SROM at 38+4 weeks with persistent breech, and planned primary LTCS.  See notes and delivery summary for details. Patient had an uncomplicated postpartum course.  By time of discharge on POD#2, her pain was controlled on oral pain medications; she had appropriate lochia and was ambulating, voiding without difficulty,  tolerating regular diet and passing flatus.   She was deemed stable for discharge to home.     Labs: CBC Latest Ref Rng & Units 07/02/2019 07/01/2019 06/07/2019  WBC 4.0 - 10.5 K/uL 15.1(H) 12.8(H) 12.9(H)  Hemoglobin 12.0 - 15.0 g/dL 10.5(L) 13.4 11.9(L)  Hematocrit 36.0 - 46.0 % 31.9(L) 40.7 35.9(L)  Platelets 150 - 400 K/uL 272 322 328   O NEG  Physical exam:  BP 133/81 (BP Location: Right Arm)   Pulse 91   Temp 98.3 F (36.8 C) (Oral)   Resp 20   Ht 5\' 3"  (1.6 m)   Wt 96.6 kg   SpO2 100%   Breastfeeding Unknown   BMI 37.73 kg/m  General: alert and no distress Pulm: normal respiratory effort Lochia: appropriate Abdomen: soft, NT Uterine Fundus: firm, below umbilicus Perineum: healing well, no significant erythema, no significant edema Incision: c/d/i, healing well, no significant drainage, no dehiscence, no significant erythema Extremities: No evidence of DVT seen on physical exam. No lower extremity edema.   Disposition: stable, discharge to home Baby Feeding: breast milk and formula Baby Disposition: home with mom  Rh Immune globulin given: given 07/02/2019 Rubella vaccine given: N/A Tdap vaccine given in AP or PP setting: UTD Flu vaccine given in AP or PP setting: N/A   Plan:  Sophia Oneill was discharged to home in good condition. Follow-up appointment at [place] in 2 weeks.  Discharge Instructions: Per After Visit Summary. Refer to After Visit Summary and [facility] discharge booklet  Activity: Advance as tolerated. Pelvic rest for 6 weeks.   Diet: Regular, Heart Healthy Discharge Medications: Allergies as of 07/03/2019      Reactions   Amoxicillin  hives   Penicillins    hives      Medication List    STOP taking these medications   Comfort Fit Maternity Supp Sm Misc     TAKE these medications   albuterol 108 (90 Base) MCG/ACT inhaler Commonly known as: VENTOLIN HFA Inhale 2 puffs every 6 (six) hours as needed into the lungs for  wheezing or shortness of breath.   diphenhydrAMINE 25 MG tablet Commonly known as: BENADRYL Take 25 mg by mouth at bedtime as needed for allergies.   fluticasone 50 MCG/ACT nasal spray Commonly known as: FLONASE Place 2 sprays into both nostrils daily.   ibuprofen 800 MG tablet Commonly known as: ADVIL Take 1 tablet (800 mg total) by mouth every 8 (eight) hours.   iron polysaccharides 150 MG capsule Commonly known as: NIFEREX Take 1 capsule (150 mg total) by mouth daily. Start taking on: July 04, 2019   montelukast 10 MG tablet Commonly known as: SINGULAIR Take 1 tablet (10 mg total) by mouth at bedtime.   multivitamin-prenatal 27-0.8 MG Tabs tablet Take 1 tablet by mouth daily at 12 noon.   oxyCODONE 5 MG immediate release tablet Commonly known as: Oxy IR/ROXICODONE Take 1-2 tablets (5-10 mg total) by mouth every 4 (four) hours as needed for moderate pain.   senna-docusate 8.6-50 MG tablet Commonly known as: Senokot-S Take 2 tablets by mouth at bedtime as needed for mild constipation.      Outpatient follow up:  Follow-up Information    Shea EvansMody, Vaishali, MD. Schedule an appointment as soon as possible for a visit in 2 week(s).   Specialty: Obstetrics and Gynecology Why: Interval visit to evaluate for postpartum depression and anxiety; then 6 week PP visit Contact information: [address] [place] [id number] [phone]           Signed:  Carlean JewsMeredith Sigmon, MSN, CNM [facility]

## 2019-07-03 NOTE — Progress Notes (Addendum)
POSTOPERATIVE DAY # 2 S/P Primary LTCS for breech with SROM, A1GDM, baby girl "Sheria LangCameron"    S:         Reports feeling sore with movement but manageable with pain medication.  Desires early d/c home today              Tolerating po intake / no nausea / no vomiting / + flatus / no BM  Denies dizziness, SOB, or CP             Bleeding is light             Pain controlled with Tylenol, Motrin, and Oxycodone IR             Up ad lib / ambulatory/ voiding QS  Newborn breast feeding with formula supplementation - mom also pumping colostrum    O:  VS: BP 133/81 (BP Location: Right Arm)   Pulse 91   Temp 98.3 F (36.8 C) (Oral)   Resp 20   Ht 5\' 3"  (1.6 m)   Wt 96.6 kg   SpO2 100%   Breastfeeding Unknown   BMI 37.73 kg/m  07/03/19 0546  98.3 F (36.8 C)  91  -  20  133/81  Semi-fowlers  100 %  -  - ET   07/02/19 2157  98.1 F (36.7 C)  82  -  18  140/84  -  100 %  -  - NS   07/02/19 1440  98 F (36.7 C)  82  -  18  125/77  Semi-fowlers  98 %  -  - DN   07/02/19 1030  98.8 F (37.1 C)  84  -  20  132/82  Semi-fowlers  100 %  -  - DN   07/02/19 0615  98.3 F (36.8 C)  -  -  18  -  -  -  -  - JL   07/02/19 0606  -  73  -  -  138/82  Lying  -  -  - JL   07/02/19 0247  98.5 F (36.9 C)  92  -  18  134/81  Semi-fowlers  98 %  -  - JL   07/02/19 0149  99 F (37.2 C)  91  -  18  127/82  Semi-fowlers  100 %  -  - JL   07/02/19 0047  98.5 F (36.9 C)  78  -  18  140/86  Semi-fowlers             LABS:               Recent Labs    07/01/19 1343 07/02/19 0508  WBC 12.8* 15.1*  HGB 13.4 10.5*  PLT 322 272               Bloodtype: --/--/O NEG (07/08 0508)  Rubella: Immune (01/07 0000)                                             I&O: Intake/Output      07/08 0701 - 07/09 0700 07/09 0701 - 07/10 0700   P.O. 480    I.V. (mL/kg) 0 (0)    Total Intake(mL/kg) 480 (5)    Urine (mL/kg/hr) 1100 (0.5)    Blood     Total Output 1100    Net -620  Physical Exam:           Alert and Oriented X3  Lungs: Clear and unlabored  Heart: regular rate and rhythm / no murmurs  Abdomen: soft, non-tender, mild gaseous distention, active bowel sounds in all quadrants              Fundus: firm, non-tender, U-1             Dressing: honeycomb with steri-strips c/d/i              Incision:  approximated with sutures / no erythema / no ecchymosis / no drainage  Perineum: intact  Lochia: scant on pad  Extremities: no edema, no calf pain or tenderness, no evidence of DVT  A:        POD # 2 S/P Primary LTCS   - on Lovenox for DVT prophylaxis              A1GDM - delivered; plan to f/u 6 weeks PP   RH Negative/ Baby RH positive - s/p Rhogam on 7/8  Hx. Of PPD - s/p CSW consult  ABL Anemia - stable on oral FE   P:        Routine postoperative care   Lactation support prior to d/c              Discharge home today   WOB discharge book given, instructions and warning s/s reviewed  Recommend 2 week interval visit for PPD/anxiety screening  Lars Pinks, MSN, CNM [facility]

## 2019-07-03 NOTE — Lactation Note (Signed)
This note was copied from a baby's chart. Lactation Consultation Note  Patient Name: Sophia Oneill [id number] Date: 07/03/2019 Reason for consult: Follow-up assessment;Difficult latch;Early term 20-38.6wks Baby is 39 hours old/6% weight loss.  Mom has been mostly syringe feeding formula.  She states baby is not sustaining a latch.  Baby is awake and showing feeding cues.  Positioned skin to skin in football hold.  Nipples are small and short shafted.  Baby latched after a few attempts but unable to sustain latch.  20 mm nipple shield applied.  Baby latched well with shield and sustained latch longer although became frustrated that flow was not faster.  Milk in shield when baby came off.  I feel baby will do very well when milk comes to volume.  Discussed prevention and treatment of engorgement.  Recommended parents begin using a bottle with slow flow nipple for supplementation to allow baby to open mouth wider.  Instructed to continue to post pump for stimulation.  Reviewed outpatient services and support and encouraged to call prn.  Maternal Data    Feeding Feeding Type: Breast Fed  LATCH Score Latch: Grasps breast easily, tongue down, lips flanged, rhythmical sucking.(with nipple shield)  Audible Swallowing: A few with stimulation  Type of Nipple: Everted at rest and after stimulation  Comfort (Breast/Nipple): Soft / non-tender  Hold (Positioning): Assistance needed to correctly position infant at breast and maintain latch.  LATCH Score: 8  Interventions Interventions: Assisted with latch;Adjust position;Skin to skin;Support pillows;Breast massage;Position options;DEBP  Lactation Tools Discussed/Used Tools: Nipple Shields Nipple shield size: 20   Consult Status Consult Status: Complete Follow-up type: Call as needed    Jahlisa Rossitto S 07/03/2019, 10:24 AM

## 2019-07-04 LAB — TYPE AND SCREEN
ABO/RH(D): O NEG
Antibody Screen: POSITIVE
Unit division: 0
Unit division: 0

## 2019-07-04 LAB — BPAM RBC
Blood Product Expiration Date: 202007172359
Blood Product Expiration Date: 202007182359
Unit Type and Rh: 9500
Unit Type and Rh: 9500

## 2019-07-05 ENCOUNTER — Encounter (HOSPITAL_COMMUNITY): Payer: Self-pay | Admitting: Obstetrics & Gynecology

## 2019-07-07 ENCOUNTER — Other Ambulatory Visit (HOSPITAL_COMMUNITY)
Admission: RE | Admit: 2019-07-07 | Discharge: 2019-07-07 | Disposition: A | Discharge status: Home / Self Care | Payer: No Typology Code available for payment source | Source: Ambulatory Visit | Attending: Registered Nurse | Admitting: Registered Nurse

## 2019-07-31 ENCOUNTER — Telehealth: Payer: No Typology Code available for payment source | Admitting: Physician Assistant

## 2019-07-31 DIAGNOSIS — B9789 Other viral agents as the cause of diseases classified elsewhere: Secondary | ICD-10-CM

## 2019-07-31 DIAGNOSIS — J019 Acute sinusitis, unspecified: Secondary | ICD-10-CM

## 2019-07-31 MED ORDER — IPRATROPIUM BROMIDE 0.03 % NA SOLN: 2.0000 | Freq: Two times a day (BID) | NASAL | 0 refills | Status: DC

## 2019-07-31 NOTE — Progress Notes (Signed)
I have spent 5 minutes in review of e-visit questionnaire, review and updating patient chart, medical decision making and response to patient.   Camyra Vaeth Cody Jossalyn Forgione, PA-C    

## 2019-07-31 NOTE — Progress Notes (Signed)
We are sorry that you are not feeling well.  Here is how we plan to help!  Based on what you have shared with me it looks like you have sinusitis.  Sinusitis is inflammation and infection in the sinus cavities of the head.  Based on your presentation I believe you most likely have Acute Viral Sinusitis.This is an infection most likely caused by a virus. There is not specific treatment for viral sinusitis other than to help you with the symptoms until the infection runs its course.  You may use an oral decongestant such as Mucinex D or if you have glaucoma or high blood pressure use plain Mucinex. Saline nasal spray help and can safely be used as often as needed for congestion, I have prescribed: Ipratropium Bromide nasal spray 0.03% 2 sprays in eah nostril 2-3 times a day This will help with symptoms caused by both viruses and allergic inflammation.  Some authorities believe that zinc sprays or the use of Echinacea may shorten the course of your symptoms.  Sinus infections are not as easily transmitted as other respiratory infection, however we still recommend that you avoid close contact with loved ones, especially the very young and elderly.  Remember to wash your hands thoroughly throughout the day as this is the number one way to prevent the spread of infection!  Home Care:  Only take medications as instructed by your medical team.  Do not take these medications with alcohol.  A steam or ultrasonic humidifier can help congestion.  You can place a towel over your head and breathe in the steam from hot water coming from a faucet.  Avoid close contacts especially the very young and the elderly.  Cover your mouth when you cough or sneeze.  Always remember to wash your hands.  Get Help Right Away If:  You develop worsening fever or sinus pain.  You develop a severe head ache or visual changes.  Your symptoms persist after you have completed your treatment plan.  Make sure  you  Understand these instructions.  Will watch your condition.  Will get help right away if you are not doing well or get worse.  Your e-visit answers were reviewed by a board certified advanced clinical practitioner to complete your personal care plan.  Depending on the condition, your plan could have included both over the counter or prescription medications.  If there is a problem please reply  once you have received a response from your provider.  Your safety is important to [place].  If you have drug allergies check your prescription carefully.    You can use MyChart to ask questions about today's visit, request a non-urgent call back, or ask for a work or school excuse for 24 hours related to this e-Visit. If it has been greater than 24 hours you will need to follow up with your provider, or enter a new e-Visit to address those concerns.  You will get an e-mail in the next two days asking about your experience.  I hope that your e-visit has been valuable and will speed your recovery. Thank you for using e-visits.

## 2019-08-22 ENCOUNTER — Other Ambulatory Visit: Payer: Self-pay | Admitting: Physician Assistant

## 2019-09-15 ENCOUNTER — Telehealth: Payer: No Typology Code available for payment source | Admitting: Physician Assistant

## 2019-09-15 DIAGNOSIS — M545 Low back pain, unspecified: Secondary | ICD-10-CM

## 2019-09-15 DIAGNOSIS — N898 Other specified noninflammatory disorders of vagina: Secondary | ICD-10-CM

## 2019-09-15 NOTE — Progress Notes (Signed)
Based on what you shared with me, I feel your condition warrants further evaluation and I recommend that you be seen for a face to face office visit.  NOTE: If you entered your credit card information for this eVisit, you will not be charged. You may see a "hold" on your card for the $35 but that hold will drop off and you will not have a charge processed.  If you are having a true medical emergency please call 911.     For an urgent face to face visit, Sophia Oneill has four urgent care centers for your convenience:   . [facility]    [phone]                  Get Driving Directions  [address] [place], [place] [address] . 10 am to 8 pm Monday-Friday . 12 pm to 8 pm Saturday-Sunday   . [facility]  [phone]                  Get Driving Directions  [address] [place], [place] [address] . 8 am to 8 pm Monday-Friday . 9 am to 6 pm Saturday . 11 am to 6 pm Sunday   . [facility] [place]  [phone]                  Get Driving Directions   [address] [place], [place] [address] . 8 am to 8 pm Monday-Friday . 8 am to 4 pm Saturday-Sunday    . [facility] [place]                    Get Driving Directions  [phone]  [address] [place],  [address]  . Monday-Friday, 12 PM to 6 PM    Your e-visit answers were reviewed by a board certified advanced clinical practitioner to complete your personal care plan.  Thank you for using e-Visits.  

## 2019-10-27 ENCOUNTER — Telehealth: Payer: No Typology Code available for payment source | Admitting: Physician Assistant

## 2019-10-27 DIAGNOSIS — R112 Nausea with vomiting, unspecified: Secondary | ICD-10-CM

## 2019-10-27 MED ORDER — ONDANSETRON HCL 4 MG PO TABS: 4.0000 mg | ORAL_TABLET | Freq: Three times a day (TID) | ORAL | 0 refills | Status: DC | PRN

## 2019-10-27 NOTE — Progress Notes (Signed)
We are sorry that you are not feeling well. Here is how we plan to help!  Based on what you have shared with me it looks like you have a Virus that is irritating your GI tract.  Vomiting is the forceful emptying of a portion of the stomach's content through the mouth.  Although nausea and vomiting can make you feel miserable, it's important to remember that these are not diseases, but rather symptoms of an underlying illness.  When we treat short term symptoms, we always caution that any symptoms that persist should be fully evaluated in a medical office.  I have prescribed a medication that will help alleviate your symptoms and allow you to stay hydrated:  Zofran 4 mg 1 tablet every 8 hours as needed for nausea and vomiting  HOME CARE:  Drink clear liquids.  This is very important! Dehydration (the lack of fluid) can lead to a serious complication.  Start off with 1 tablespoon every 5 minutes for 8 hours.  You may begin eating bland foods after 8 hours without vomiting.  Start with saltine crackers, white bread, rice, mashed potatoes, applesauce.  After 48 hours on a bland diet, you may resume a normal diet.  Try to go to sleep.  Sleep often empties the stomach and relieves the need to vomit.  GET HELP RIGHT AWAY IF:   Your symptoms do not improve or worsen within 2 days after treatment.  You have a fever for over 3 days.  You cannot keep down fluids after trying the medication.  MAKE SURE YOU:   Understand these instructions.  Will watch your condition.  Will get help right away if you are not doing well or get worse.   Thank you for choosing an e-visit. Your e-visit answers were reviewed by a board certified advanced clinical practitioner to complete your personal care plan. Depending upon the condition, your plan could have included both over the counter or prescription medications. Please review your pharmacy choice. Be sure that the pharmacy you have chosen is open so  that you can pick up your prescription now.  If there is a problem you may message your provider in MyChart to have the prescription routed to another pharmacy. Your safety is important to us. If you have drug allergies check your prescription carefully.  For the next 24 hours, you can use MyChart to ask questions about today's visit, request a non-urgent call back, or ask for a work or school excuse from your e-visit provider. You will get an e-mail in the next two days asking about your experience. I hope that your e-visit has been valuable and will speed your recovery.   Greater than 5 minutes, yet less than 10 minutes of time have been spent researching, coordinating and implementing care for this patient today.   

## 2019-11-22 ENCOUNTER — Telehealth: Payer: No Typology Code available for payment source | Admitting: Physician Assistant

## 2019-11-22 DIAGNOSIS — J019 Acute sinusitis, unspecified: Secondary | ICD-10-CM | POA: Diagnosis not present

## 2019-11-22 DIAGNOSIS — B9689 Other specified bacterial agents as the cause of diseases classified elsewhere: Secondary | ICD-10-CM | POA: Diagnosis not present

## 2019-11-22 MED ORDER — DOXYCYCLINE HYCLATE 100 MG PO CAPS: 100.0000 mg | ORAL_CAPSULE | Freq: Two times a day (BID) | ORAL | 0 refills | Status: DC

## 2019-11-22 NOTE — Progress Notes (Signed)
Sent message asking about breast feeding status. Awaiting response.

## 2019-11-22 NOTE — Progress Notes (Signed)

## 2019-11-22 NOTE — Progress Notes (Signed)
I have spent 5 minutes in review of e-visit questionnaire, review and updating patient chart, medical decision making and response to patient.   Arlenne Kimbley Cody Chandlar Guice, PA-C    

## 2020-03-11 ENCOUNTER — Ambulatory Visit (INDEPENDENT_AMBULATORY_CARE_PROVIDER_SITE_OTHER): Payer: BC Managed Care – PPO | Admitting: Family Medicine

## 2020-03-11 DIAGNOSIS — Z0289 Encounter for other administrative examinations: Secondary | ICD-10-CM

## 2020-03-23 ENCOUNTER — Other Ambulatory Visit: Payer: Self-pay

## 2020-03-23 ENCOUNTER — Encounter (INDEPENDENT_AMBULATORY_CARE_PROVIDER_SITE_OTHER): Payer: Self-pay | Admitting: Family Medicine

## 2020-03-23 ENCOUNTER — Ambulatory Visit (INDEPENDENT_AMBULATORY_CARE_PROVIDER_SITE_OTHER): Payer: BC Managed Care – PPO | Admitting: Family Medicine

## 2020-03-23 VITALS — BP 146/85 | HR 101 | Temp 98.4°F | Ht 64.0 in | Wt 225.0 lb

## 2020-03-23 DIAGNOSIS — E282 Polycystic ovarian syndrome: Secondary | ICD-10-CM

## 2020-03-23 DIAGNOSIS — R5383 Other fatigue: Secondary | ICD-10-CM

## 2020-03-23 DIAGNOSIS — E559 Vitamin D deficiency, unspecified: Secondary | ICD-10-CM | POA: Diagnosis not present

## 2020-03-23 DIAGNOSIS — Z9189 Other specified personal risk factors, not elsewhere classified: Secondary | ICD-10-CM | POA: Diagnosis not present

## 2020-03-23 DIAGNOSIS — R0602 Shortness of breath: Secondary | ICD-10-CM

## 2020-03-23 DIAGNOSIS — Z1331 Encounter for screening for depression: Secondary | ICD-10-CM | POA: Diagnosis not present

## 2020-03-23 DIAGNOSIS — Z6838 Body mass index (BMI) 38.0-38.9, adult: Secondary | ICD-10-CM

## 2020-03-23 DIAGNOSIS — E8881 Metabolic syndrome: Secondary | ICD-10-CM

## 2020-03-23 NOTE — Progress Notes (Signed)
Chief Complaint:   OBESITY Sophia Oneill (MR# [id number]) is a 34 y.o. female who presents for evaluation and treatment of obesity and related comorbidities. Current BMI is Body mass index is 38.62 kg/m. Sophia Oneill has been struggling with her weight for many years and has been unsuccessful in either losing weight, maintaining weight loss, or reaching her healthy weight goal.  Sophia Oneill is currently in the action stage of change and ready to dedicate time achieving and maintaining a healthier weight. Sophia Oneill is interested in becoming our patient and working on intensive lifestyle modifications including (but not limited to) diet and exercise for weight loss.  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  She is married with 2 children, a 10-year-old son and an 81-month-old daughter.  She is not breastfeeding.  She reports that she does not want more children.  Her husband started the program last week (Category 4).  Sophia Oneill's habits were reviewed today and are as follows: Her family eats meals together, she thinks her family will eat healthier with her, her desired weight loss is 40 pounds, she started gaining weight at age 17, her heaviest weight ever was 225 pounds, she is a picky eater, she craves pasta and salad, she skips breakfast or lunch frequently, she is frequently drinking liquids with calories, she sometimes makes poor food choices and she struggles with emotional eating.  Depression [employer]'s Food and Mood (modified PHQ-9) score was 13.  Depression screen PHQ 2/9 03/23/2020  Decreased Interest 1  Down, Depressed, Hopeless 1  PHQ - 2 Score 2  Altered sleeping 3  Tired, decreased energy 3  Change in appetite 3  Feeling bad or failure about yourself  1  Trouble concentrating 1  Moving slowly or fidgety/restless 0  Suicidal thoughts 0  PHQ-9 Score 13  Difficult doing work/chores Somewhat difficult   Subjective:   1. Other fatigue Sophia Oneill denies daytime somnolence and reports waking  up still tired. Patent has a history of symptoms of morning fatigue. Sophia Oneill generally gets 4 hours of sleep per night, and states that she has poor quality sleep. Snoring is not present. Apneic episodes are not present. Epworth Sleepiness Score is 2.  2. SOB (shortness of breath) on exertion Sophia Oneill notes increasing shortness of breath with exercising and seems to be worsening over time with weight gain. She notes getting out of breath sooner with activity than she used to. This has not gotten worse recently. Sophia Oneill denies shortness of breath at rest or orthopnea.  3. PCOS (polycystic ovarian syndrome) Sophia Oneill has the diagnosis of PCOS.  4. Vitamin D deficiency She is currently taking OTC vitamin D 2000 each day. She denies nausea, vomiting or muscle weakness.  5. Insulin resistance Sophia Oneill has a diagnosis of insulin resistance based on her elevated fasting insulin level >5. She continues to work on diet and exercise to decrease her risk of diabetes.  No results found for: INSULIN Lab Results  Component Value Date   HGBA1C 5.7 07/11/2016   6. Depression screening Sophia Oneill was screened for depression as part of her new patient workup today.  7. At risk for diabetes mellitus Sophia Oneill is at higher than average risk for developing diabetes due to her obesity.   Assessment/Plan:   1. Other fatigue Sophia Oneill does feel that her weight is causing her energy to be lower than it should be. Fatigue may be related to obesity, depression or many other causes. Labs will be ordered, and in the meanwhile, Sophia Oneill will focus  on self care including making healthy food choices, increasing physical activity and focusing on stress reduction.  Orders - EKG 12-Lead - Comprehensive metabolic panel - CBC with Differential/Platelet - Lipid panel  2. SOB (shortness of breath) on exertion Sophia Oneill does feel that she gets out of breath more easily that she used to when she exercises. Sophia Oneill's shortness of breath appears  to be obesity related and exercise induced. She has agreed to work on weight loss and gradually increase exercise to treat her exercise induced shortness of breath. Will continue to monitor closely.  3. PCOS (polycystic ovarian syndrome) Intensive lifestyle modifications are first line treatment for this issue. We discussed several lifestyle modifications today and she will continue to work on diet, exercise and weight loss efforts. Orders and follow up as documented in patient record.  Counseling . PCOS is a leading cause of menstrual irregularities and infertility. It is also associated with obesity, hirsutism (excessive hair growth on the face, chest, or back), and cardiovascular risk factors such as high cholesterol and insulin resistance. . Insulin resistance appears to play a central role.  . Women with PCOS have been shown to have impaired appetite-regulating hormones. . Metformin is one medication that can improve metabolic parameters.  . Women with polycystic ovary syndrome (PCOS) have an increased risk for cardiovascular disease (CVD) - European Journal of Preventive Cardiology.  Orders - T4, free - T3 - TSH - Anemia panel  4. Vitamin D deficiency Low Vitamin D level contributes to fatigue and are associated with obesity, breast, and colon cancer. She agrees to continue to take Vitamin D @2 ,000 IU daily.   Orders - VITAMIN D 25 Hydroxy (Vit-D Deficiency, Fractures)  5. Insulin resistance Sophia Oneill will continue to work on weight loss, exercise, and decreasing simple carbohydrates to help decrease the risk of diabetes. Sophia Oneill agreed to follow-up with [place] as directed to closely monitor her progress.  Orders - Hemoglobin A1c - Insulin, random  6. Depression screening Sophia Oneill had a positive depression screening. Depression is commonly associated with obesity and often results in emotional eating behaviors. We will monitor this closely and work on CBT to help improve the non-hunger  eating patterns. Referral to Psychology may be required if no improvement is seen as she continues in our clinic.  7. At risk for diabetes mellitus Sophia Oneill was given approximately 15 minutes of diabetes education and counseling today. We discussed intensive lifestyle modifications today with an emphasis on weight loss as well as increasing exercise and decreasing simple carbohydrates in her diet. We also reviewed medication options with an emphasis on risk versus benefit of those discussed.   Repetitive spaced learning was employed today to elicit superior memory formation and behavioral change.  8. Class 2 severe obesity with serious comorbidity and body mass index (BMI) of 38.0 to 38.9 in adult, unspecified obesity type (HCC) Sophia Oneill is currently in the action stage of change and her goal is to continue with weight loss efforts. I recommend Sophia Oneill begin the structured treatment plan as follows:  She has agreed to the Category 2 Plan.  Exercise goals: No exercise has been prescribed at this time.   Behavioral modification strategies: increasing lean protein intake, decreasing simple carbohydrates, increasing vegetables, increasing water intake and decreasing liquid calories.  She was informed of the importance of frequent follow-up visits to maximize her success with intensive lifestyle modifications for her multiple health conditions. She was informed we would discuss her lab results at her next visit unless there is a  critical issue that needs to be addressed sooner. Sophia Oneill agreed to keep her next visit at the agreed upon time to discuss these results.  Objective:   Blood pressure (!) 146/85, pulse (!) 101, temperature 98.4 F (36.9 C), temperature source Oral, height 5\' 4"  (1.626 m), weight 225 lb (102.1 kg), last menstrual period 03/18/2020, SpO2 100 %, unknown if currently breastfeeding. Body mass index is 38.62 kg/m.  EKG: Normal sinus rhythm, rate 92 bpm.  Indirect Calorimeter  completed today shows a VO2 of 264 and a REE of 1840.  Her calculated basal metabolic rate is 03/20/2020 thus her basal metabolic rate is better than expected.  General: Cooperative, alert, well developed, in no acute distress. HEENT: Conjunctivae and lids unremarkable. Cardiovascular: Regular rhythm.  Lungs: Normal work of breathing. Neurologic: No focal deficits.   Lab Results  Component Value Date   CREATININE 0.74 07/02/2019   BUN 7 07/01/2019   NA 137 07/01/2019   K 4.0 07/01/2019   CL 106 07/01/2019   CO2 20 (L) 07/01/2019   Lab Results  Component Value Date   ALT 13 07/01/2019   AST 13 (L) 07/01/2019   ALKPHOS 137 (H) 07/01/2019   BILITOT 0.7 07/01/2019   Lab Results  Component Value Date   HGBA1C 5.7 07/11/2016   HGBA1C 5.8 11/18/2013   Lab Results  Component Value Date   TSH 1.30 07/11/2016   Lab Results  Component Value Date   WBC 15.1 (H) 07/02/2019   HGB 10.5 (L) 07/02/2019   HCT 31.9 (L) 07/02/2019   MCV 80.6 07/02/2019   PLT 272 07/02/2019   Attestation Statements:   This is the patient's first visit at [facility]. The patient's NEW PATIENT PACKET was reviewed at length. Included in the packet: current and past health history, medications, allergies, ROS, gynecologic history (women only), surgical history, family history, social history, weight history, weight loss surgery history (for those that have had weight loss surgery), nutritional evaluation, mood and food questionnaire, PHQ9, Epworth questionnaire, sleep habits questionnaire, patient life and health improvement goals questionnaire. These will all be scanned into the patient's chart under media.   During the visit, I independently reviewed the patient's EKG, bioimpedance scale results, and indirect calorimeter results. I used this information to tailor a meal plan for the patient that will help her to lose weight and will improve her obesity-related conditions going forward. I performed a  medically necessary appropriate examination and/or evaluation. I discussed the assessment and treatment plan with the patient. The patient was provided an opportunity to ask questions and all were answered. The patient agreed with the plan and demonstrated an understanding of the instructions. Labs were ordered at this visit and will be reviewed at the next visit unless more critical results need to be addressed immediately. Clinical information was updated and documented in the EMR.   I, 09/02/2019, CMA, am acting as Insurance claims handler for Energy manager, DO.  I have reviewed the above documentation for accuracy and completeness, and I agree with the above. [employer], DO

## 2020-03-23 NOTE — Progress Notes (Signed)
Office: [phone]  /  Fax: [phone]    Date: March 25, 2020   Appointment Start Time: 11:00am Duration: 23 minutes Provider: Glennie Isle, Psy.D. Type of Session: Intake for Individual Therapy  Location of Patient: Home Location of Provider: Provider's Home Type of Contact: Telepsychological Visit via [employer] Web[employer]  Informed Consent: Prior to proceeding with today's appointment, two pieces of identifying information were obtained. In addition, Beatric's physical location at the time of this appointment was obtained as well a phone number she could be reached at in the event of technical difficulties. Lulabelle and this provider participated in today's telepsychological service.   The provider's role was explained to [facility]. The provider reviewed and discussed issues of confidentiality, privacy, and limits therein (e.g., reporting obligations). In addition to verbal informed consent, written informed consent for psychological services was obtained prior to the initial appointment. Since the clinic is not a 24/7 crisis center, mental health emergency resources were shared and this  provider explained MyChart, e-mail, voicemail, and/or other messaging systems should be utilized only for non-emergency reasons. This provider also explained that information obtained during appointments will be placed in [facility] record and relevant information will be shared with other providers at [facility] for coordination of care. Moreover, Shaine agreed information may be shared with other [facility] providers as needed for coordination of care. By signing the service agreement document, Maryella provided written consent for coordination of care. Prior to initiating telepsychological services, Palak completed an informed consent document, which included the development of a safety plan (i.e., an emergency contact, nearest emergency room, and emergency resources) in  the event of an emergency/crisis. Jamera expressed understanding of the rationale of the safety plan. Aireonna verbally acknowledged understanding she is ultimately responsible for understanding her insurance benefits for telepsychological and in-person services. This provider also reviewed confidentiality, as it relates to telepsychological services, as well as the rationale for telepsychological services (i.e., to reduce exposure risk to COVID-19). Solae  acknowledged understanding that appointments cannot be recorded without both party consent and she is aware she is responsible for securing confidentiality on her end of the session. Avary verbally consented to proceed.  Chief Complaint/HPI: Dajanique was referred by Dr. Briscoe Deutscher. The note for the initial appointment (March 23, 2020) with Dr. Briscoe Deutscher indicated the following: "Kyrie's habits were reviewed today and are as follows: Her family eats meals together, she thinks her family will eat healthier with her, her desired weight loss is 40 pounds, she started gaining weight at age 34, her heaviest weight ever was 225 pounds, she is a picky eater, she craves pasta and salad, she skips breakfast or lunch frequently, she is frequently drinking liquids with calories, she sometimes makes poor food choices and she struggles with emotional eating." Paxton's Food and Mood (modified PHQ-9) score on March 23, 2020 was 13.  During today's appointment, Keneshia was verbally administered a questionnaire assessing various behaviors related to emotional eating. Bree endorsed the following: experience food cravings on a regular basis. She shared she craves carbohydrates (e.g., pasta). Jeaninne discussed "going half a day without eating anything" and snacking in the evenings after dinner. She added she has a 69 month old; therefore, she is up late. In addition, Adea denied a history of binge eating. Olevia denied a history of restricting food intake, purging and  engagement in other compensatory strategies, and has never been diagnosed with an eating disorder. She also denied a history of treatment for  emotional eating. Furthermore, Jessilyn denied other problems of concern.    Mental Status Examination:  Appearance: well groomed and appropriate hygiene  Behavior: appropriate to circumstances Mood: euthymic Affect: mood congruent Speech: normal in rate, volume, and tone Eye Contact: appropriate Psychomotor Activity: appropriate Gait: unable to assess Thought Process: linear, logical, and goal directed  Thought Content/Perception: denies suicidal and homicidal ideation, plan, and intent and no hallucinations, delusions, bizarre thinking or behavior reported or observed Orientation: time, person, place and purpose of appointment Memory/Concentration: memory, attention, language, and fund of knowledge intact  Insight/Judgment: good  Family & Psychosocial History: Korissa reported she is married and she has two children (ages 10 and 9 months). She indicated she is currently not working. Additionally, Eyanna shared her highest level of education obtained is a high school diploma. Currently, Emerlyn's social support system consists of her husband, mom, dad, brothers, and sisters. Moreover, Shyleigh stated she resides with her husband and children.   Medical History:  Past Medical History:  Diagnosis Date  . Depression   . GDM (gestational diabetes mellitus) 04/2019  . GERD (gastroesophageal reflux disease)   . Gestational diabetes   . PCOS (polycystic ovarian syndrome) 02/2013  . Tachycardia   . Vitamin D deficiency    Past Surgical History:  Procedure Laterality Date  . CESAREAN SECTION N/A 07/01/2019   Procedure: Primary CESAREAN SECTION;  Surgeon: Azucena Fallen, MD;  Location: [facility];  Service: Obstetrics;  Laterality: N/A;  EDD: 07/11/19  . WISDOM TOOTH EXTRACTION     Current Outpatient Medications on File Prior to Visit  Medication Sig Dispense  Refill  . acetaminophen (TYLENOL) 500 MG tablet Take 1,000 mg by mouth daily.    Marland Kitchen albuterol (PROVENTIL HFA;VENTOLIN HFA) 108 (90 Base) MCG/ACT inhaler Inhale 2 puffs every 6 (six) hours as needed into the lungs for wheezing or shortness of breath. 1 Inhaler 0  . Cholecalciferol (VITAMIN D) 50 MCG (2000 UT) CAPS Take 1 capsule by mouth daily.    . diphenhydrAMINE (BENADRYL) 25 MG tablet Take 25 mg by mouth at bedtime as needed for allergies.    Marland Kitchen iron polysaccharides (NIFEREX) 150 MG capsule Take 1 capsule (150 mg total) by mouth daily. 30 capsule 3  . Prenatal Vit-Fe Fumarate-FA (MULTIVITAMIN-PRENATAL) 27-0.8 MG TABS tablet Take 1 tablet by mouth daily at 12 noon.    . vitamin B-12 (CYANOCOBALAMIN) 1000 MCG tablet Take 1,000 mcg by mouth daily.     No current facility-administered medications on file prior to visit.  Aiden denied a history of head injuries and loss of consciousness.    Mental Health History: Maelee reported there is no history of therapeutic services. Shenell reported there is no history of hospitalizations for psychiatric concerns, and she has never met with a psychiatrist. Cornelious stated she has never been prescribed psychotropic medications. Mozetta denied a family history of mental health related concerns. Merleen reported there is no history of trauma including psychological, physical  and sexual abuse, as well as neglect.   Denette described her typical mood lately as "cheerful." Aside from concerns noted above and endorsed on the PHQ-9 and GAD-7, Poppi reported experiencing worry thoughts about moving, son starting school, and deciding on childcare for her daughter. Bryleigh denied current alcohol use. She denied tobacco use. She denied illicit/recreational substance use. She denied caffeine intake. Furthermore, [name] indicated she is not experiencing the following: hallucinations and delusions, paranoia, symptoms of mania , social withdrawal, crying spells, panic attacks and  decreased motivation. She also  denied history of and current suicidal ideation, plan, and intent; history of and current homicidal ideation, plan, and intent; and history of and current engagement in self-harm.  The following strengths were reported by [facility]: motivation and helpful. The following strengths were observed by this provider: ability to express thoughts and feelings during the therapeutic session, ability to establish and benefit from a therapeutic relationship, willingness to work toward established goal(s) with the clinic and ability to engage in reciprocal conversation.  Legal History: Shateka reported there is no history of legal involvement.   Structured Assessments Results: The Patient Health Questionnaire-9 (PHQ-9) is a self-report measure that assesses symptoms and severity of depression over the course of the last two weeks. Tujuana obtained a score of 7 suggesting mild depression. Arriah finds the endorsed symptoms to be somewhat difficult. [0= Not at all; 1= Several days; 2= More than half the days; 3= Nearly every day] Little interest or pleasure in doing things 0  Feeling down, depressed, or hopeless 0  Trouble falling or staying asleep, or sleeping too much 3  Feeling tired or having little energy 1  Poor appetite or overeating 3  Feeling bad about yourself --- or that you are a failure or have let yourself or your family down 0  Trouble concentrating on things, such as reading the newspaper or watching television 0  Moving or speaking so slowly that other people could have noticed? Or the opposite --- being so fidgety or restless that you have been moving around a lot more than usual 0  Thoughts that you would be better off dead or hurting yourself in some way 0  PHQ-9 Score 7    The Generalized Anxiety Disorder-7 (GAD-7) is a brief self-report measure that assesses symptoms of anxiety over the course of the last two weeks. Tiffnay obtained a score of 2 suggesting minimal  anxiety. Eleanore finds the endorsed symptoms to be somewhat difficult. [0= Not at all; 1= Several days; 2= Over half the days; 3= Nearly every day] Feeling nervous, anxious, on edge 0  Not being able to stop or control worrying 0  Worrying too much about different things 2  Trouble relaxing 0  Being so restless that it's hard to sit still 0  Becoming easily annoyed or irritable 0  Feeling afraid as if something awful might happen 0  GAD-7 Score 2   Interventions:  Conducted a chart review Focused on rapport building Verbally administered PHQ-9 and GAD-7 for symptom monitoring Verbally administered Food & Mood questionnaire to assess various behaviors related to emotional eating. Provided emphatic reflections and validation Collaborated with patient on a treatment goal  Psychoeducation provided regarding physical versus emotional hunger  Provisional DSM-5 Diagnosis(es): 307.59 (F50.8) Other Specified Feeding or Eating Disorder, Emotional Eating Behaviors  Plan: Rian appears able and willing to participate as evidenced by collaboration on a treatment goal, engagement in reciprocal conversation, and asking questions as needed for clarification. The next appointment will be scheduled in approximately two weeks, which will be via MyChart Video Visit. The following treatment goal was established: increase coping skills. This provider will regularly review the treatment plan and medical chart to keep informed of status changes. Taaliyah expressed understanding and agreement with the initial treatment plan of care. Avagrace will be sent a handout via e-mail to utilize between now and the next appointment to increase awareness of hunger patterns and subsequent eating. Nikky provided verbal consent during today's appointment for this provider to send the handout via e-mail.

## 2020-03-24 LAB — CBC WITH DIFFERENTIAL/PLATELET
Basophils Absolute: 0.1 10*3/uL (ref 0.0–0.2)
Basos: 1 %
EOS (ABSOLUTE): 0.2 10*3/uL (ref 0.0–0.4)
Eos: 2 %
Hemoglobin: 14.6 g/dL (ref 11.1–15.9)
Immature Grans (Abs): 0 10*3/uL (ref 0.0–0.1)
Immature Granulocytes: 0 %
Lymphocytes Absolute: 3 10*3/uL (ref 0.7–3.1)
Lymphs: 34 %
MCH: 27.3 pg (ref 26.6–33.0)
MCHC: 32.5 g/dL (ref 31.5–35.7)
MCV: 84 fL (ref 79–97)
Monocytes Absolute: 0.4 10*3/uL (ref 0.1–0.9)
Monocytes: 4 %
Neutrophils Absolute: 5.3 10*3/uL (ref 1.4–7.0)
Neutrophils: 59 %
Platelets: 411 10*3/uL (ref 150–450)
RBC: 5.35 x10E6/uL — ABNORMAL HIGH (ref 3.77–5.28)
RDW: 13.6 % (ref 11.7–15.4)
WBC: 9 10*3/uL (ref 3.4–10.8)

## 2020-03-24 LAB — COMPREHENSIVE METABOLIC PANEL
ALT: 18 IU/L (ref 0–32)
AST: 21 IU/L (ref 0–40)
Albumin/Globulin Ratio: 1.6 (ref 1.2–2.2)
Albumin: 4.6 g/dL (ref 3.8–4.8)
Alkaline Phosphatase: 77 IU/L (ref 39–117)
BUN/Creatinine Ratio: 18 (ref 9–23)
BUN: 14 mg/dL (ref 6–20)
Bilirubin Total: 0.4 mg/dL (ref 0.0–1.2)
CO2: 20 mmol/L (ref 20–29)
Calcium: 9.5 mg/dL (ref 8.7–10.2)
Chloride: 101 mmol/L (ref 96–106)
Creatinine, Ser: 0.77 mg/dL (ref 0.57–1.00)
GFR calc Af Amer: 118 mL/min/{1.73_m2} (ref >59)
GFR calc non Af Amer: 102 mL/min/{1.73_m2} (ref >59)
Globulin, Total: 2.8 g/dL (ref 1.5–4.5)
Glucose: 88 mg/dL (ref 65–99)
Potassium: 4.7 mmol/L (ref 3.5–5.2)
Sodium: 136 mmol/L (ref 134–144)
Total Protein: 7.4 g/dL (ref 6.0–8.5)

## 2020-03-24 LAB — ANEMIA PANEL
Ferritin: 51 ng/mL (ref 15–150)
Folate, Hemolysate: 402 ng/mL
Folate, RBC: 895 ng/mL (ref >498)
Hematocrit: 44.9 % (ref 34.0–46.6)
Iron Saturation: 15 % (ref 15–55)
Iron: 51 ug/dL (ref 27–159)
Retic Ct Pct: 1.3 % (ref 0.6–2.6)
Total Iron Binding Capacity: 344 ug/dL (ref 250–450)
UIBC: 293 ug/dL (ref 131–425)
Vitamin B-12: 542 pg/mL (ref 232–1245)

## 2020-03-24 LAB — VITAMIN D 25 HYDROXY (VIT D DEFICIENCY, FRACTURES): Vit D, 25-Hydroxy: 21.6 ng/mL — ABNORMAL LOW (ref 30.0–100.0)

## 2020-03-24 LAB — LIPID PANEL
Chol/HDL Ratio: 3.7 ratio (ref 0.0–4.4)
Cholesterol, Total: 194 mg/dL (ref 100–199)
HDL: 52 mg/dL (ref >39)
LDL Chol Calc (NIH): 107 mg/dL — ABNORMAL HIGH (ref 0–99)
Triglycerides: 204 mg/dL — ABNORMAL HIGH (ref 0–149)
VLDL Cholesterol Cal: 35 mg/dL (ref 5–40)

## 2020-03-24 LAB — T3: T3, Total: 137 ng/dL (ref 71–180)

## 2020-03-24 LAB — HEMOGLOBIN A1C
Est. average glucose Bld gHb Est-mCnc: 120 mg/dL
Hgb A1c MFr Bld: 5.8 % — ABNORMAL HIGH (ref 4.8–5.6)

## 2020-03-24 LAB — T4, FREE: Free T4: 0.97 ng/dL (ref 0.82–1.77)

## 2020-03-24 LAB — TSH: TSH: 1.65 u[IU]/mL (ref 0.450–4.500)

## 2020-03-24 LAB — INSULIN, RANDOM: INSULIN: 26.2 u[IU]/mL — ABNORMAL HIGH (ref 2.6–24.9)

## 2020-03-25 ENCOUNTER — Other Ambulatory Visit: Payer: Self-pay

## 2020-03-25 ENCOUNTER — Ambulatory Visit (INDEPENDENT_AMBULATORY_CARE_PROVIDER_SITE_OTHER): Payer: BC Managed Care – PPO | Admitting: Psychology

## 2020-03-25 ENCOUNTER — Ambulatory Visit (INDEPENDENT_AMBULATORY_CARE_PROVIDER_SITE_OTHER): Payer: Self-pay | Admitting: Family Medicine

## 2020-03-25 DIAGNOSIS — F5089 Other specified eating disorder: Secondary | ICD-10-CM | POA: Diagnosis not present

## 2020-03-30 NOTE — Progress Notes (Unsigned)
Office: [phone]  /  Fax: [phone]    Date: April 13, 2020   Appointment Start Time: *** Duration: *** minutes Provider: Glennie Isle, Psy.D. Type of Session: Individual Therapy  Location of Patient: {gbptloc:23249} Location of Provider: {Location of Service:22491} Type of Contact: Telepsychological Visit via MyChart Video Visit  Session Content: This provider called Verneal at 2:33pm as she did not present for the telepsychological appointment. A HIPAA compliant voicemail was left requesting a call back. As such, today's appointment was initiated *** minutes late.  Sophia Oneill is a 34 y.o. female presenting via [facility] Visit for a follow-up appointment to address the previously established treatment goal of increasing coping skills. Today's appointment was a telepsychological visit due to COVID-19. Sophia Oneill provided verbal consent for today's telepsychological appointment and she is aware she is responsible for securing confidentiality on her end of the session. Prior to proceeding with today's appointment, Samya's physical location at the time of this appointment was obtained as well a phone number she could be reached at in the event of technical difficulties. Sophia Oneill and this provider participated in today's telepsychological service.   Chelcey acknowledged understanding that for today's appointment and future telepsychological appointments, MyChart will be utilized. She also verbally acknowledged understanding that the information outlined in the telepsychological informed consent form signed at the onset of treatment would still be applicable despite the change in the videoconferencing platform.   This provider conducted a brief check-in and verbally administered the PHQ-9 and GAD-7. ***Psychoeducation regarding triggers for emotional eating was provided. Sophia Oneill was provided a handout, and encouraged to utilize the handout between now and the next appointment to increase awareness of  triggers and frequency. Sophia Oneill agreed. This provider also discussed behavioral strategies for specific triggers, such as placing the utensil down when conversing to avoid mindless eating. Sophia Oneill provided verbal consent during today's appointment for this provider to send *** via e-mail.  Sophia Oneill was receptive to today's appointment as evidenced by openness to sharing, responsiveness to feedback, and {gbreceptiveness:23401}.  Mental Status Examination:  Appearance: {Appearance:22431} Behavior: {Behavior:22445} Mood: {gbmood:21757} Affect: {Affect:22436} Speech: {Speech:22432} Eye Contact: {Eye Contact:22433} Psychomotor Activity: {Motor Activity:22434} Gait: {gbgait:23404} Thought Process: {thought process:22448}  Thought Content/Perception: {disturbances:22451} Orientation: {Orientation:22437} Memory/Concentration: {gbcognition:22449} Insight/Judgment: {Insight:22446}  Structured Assessments Results: The Patient Health Questionnaire-9 (PHQ-9) is a self-report measure that assesses symptoms and severity of depression over the course of the last two weeks. Sophia Oneill obtained a score of *** suggesting {GBPHQ9SEVERITY:21752}. Sophia Oneill finds the endorsed symptoms to be {gbphq9difficulty:21754}. [0= Not at all; 1= Several days; 2= More than half the days; 3= Nearly every day] Little interest or pleasure in doing things ***  Feeling down, depressed, or hopeless ***  Trouble falling or staying asleep, or sleeping too much ***  Feeling tired or having little energy ***  Poor appetite or overeating ***  Feeling bad about yourself --- or that you are a failure or have let yourself or your family down ***  Trouble concentrating on things, such as reading the newspaper or watching television ***  Moving or speaking so slowly that other people could have noticed? Or the opposite --- being so fidgety or restless that you have been moving around a lot more than usual ***  Thoughts that you would be better off  dead or hurting yourself in some way ***  PHQ-9 Score ***    The Generalized Anxiety Disorder-7 (GAD-7) is a brief self-report measure that assesses symptoms of anxiety over the course of the last two weeks. Sophia Oneill  obtained a score of *** suggesting {gbgad7severity:21753}. Sophia Oneill finds the endorsed symptoms to be {gbphq9difficulty:21754}. [0= Not at all; 1= Several days; 2= Over half the days; 3= Nearly every day] Feeling nervous, anxious, on edge ***  Not being able to stop or control worrying ***  Worrying too much about different things ***  Trouble relaxing ***  Being so restless that it's hard to sit still ***  Becoming easily annoyed or irritable ***  Feeling afraid as if something awful might happen ***  GAD-7 Score ***   Interventions:  {Interventions for Progress Notes:23405}  DSM-5 Diagnosis(es): 307.59 (F50.8) Other Specified Feeding or Eating Disorder, Emotional Eating Behaviors  Treatment Goal & Progress: During the initial appointment with this provider, the following treatment goal was established: increase coping skills. Sophia Oneill has demonstrated progress in her goal as evidenced by {gbtxprogress:22839}. Sophia Oneill also {gbtxprogress2:22951}.  Plan: The next appointment will be scheduled in {gbweeks:21758}, which will be {gbtxmodality:23402}. The next session will focus on {Plan for Next Appointment:23400}.

## 2020-04-06 ENCOUNTER — Encounter (INDEPENDENT_AMBULATORY_CARE_PROVIDER_SITE_OTHER): Payer: Self-pay | Admitting: Family Medicine

## 2020-04-06 ENCOUNTER — Ambulatory Visit (INDEPENDENT_AMBULATORY_CARE_PROVIDER_SITE_OTHER): Payer: BC Managed Care – PPO | Admitting: Family Medicine

## 2020-04-06 ENCOUNTER — Other Ambulatory Visit: Payer: Self-pay

## 2020-04-06 VITALS — BP 136/82 | HR 108 | Temp 98.2°F | Ht 64.0 in | Wt 224.0 lb

## 2020-04-06 DIAGNOSIS — Z9189 Other specified personal risk factors, not elsewhere classified: Secondary | ICD-10-CM | POA: Diagnosis not present

## 2020-04-06 DIAGNOSIS — F3289 Other specified depressive episodes: Secondary | ICD-10-CM

## 2020-04-06 DIAGNOSIS — Z6838 Body mass index (BMI) 38.0-38.9, adult: Secondary | ICD-10-CM

## 2020-04-06 DIAGNOSIS — E559 Vitamin D deficiency, unspecified: Secondary | ICD-10-CM | POA: Diagnosis not present

## 2020-04-06 DIAGNOSIS — R7303 Prediabetes: Secondary | ICD-10-CM | POA: Diagnosis not present

## 2020-04-06 DIAGNOSIS — E66812 Obesity, class 2: Secondary | ICD-10-CM

## 2020-04-06 MED ORDER — VITAMIN D (ERGOCALCIFEROL) 1.25 MG (50000 UNIT) PO CAPS: 50000.0000 [IU] | ORAL_CAPSULE | ORAL | 0 refills | Status: DC

## 2020-04-06 MED ORDER — SAXENDA 18 MG/3ML ~~LOC~~ SOPN: 3.0000 mg | PEN_INJECTOR | Freq: Every day | SUBCUTANEOUS | 0 refills | Status: DC

## 2020-04-06 MED ORDER — BD PEN NEEDLE NANO 2ND GEN 32G X 4 MM MISC: 1.0000 | Freq: Every day | 0 refills | Status: DC

## 2020-04-07 ENCOUNTER — Encounter (INDEPENDENT_AMBULATORY_CARE_PROVIDER_SITE_OTHER): Payer: Self-pay | Admitting: *Deleted

## 2020-04-07 NOTE — Progress Notes (Signed)
Chief Complaint:   OBESITY Sophia Oneill is here to discuss her progress with her obesity treatment plan along with follow-up of her obesity related diagnoses. [name] is on the Category 2 Plan and states she is following her eating plan approximately 100% of the time. [name] states she is doing cardio for 30 minutes 3 times per week.  Today's visit was #: 2 Starting weight: 225 lbs Starting date: 03/23/2020 Today's weight: 224 lbs Today's date: 04/07/2020 Total lbs lost to date: 1 lb Total lbs lost since last in-office visit: 1 lb  Interim History: [name] reports no hunger issues.  She says she is getting 5-6 hours of sleep at night due to her infant daughter.  Subjective:   1. Prediabetes [name] has a diagnosis of prediabetes based on her elevated HgA1c and was informed this puts her at greater risk of developing diabetes. She continues to work on diet and exercise to decrease her risk of diabetes. She denies nausea or hypoglycemia.  Lab Results  Component Value Date   HGBA1C 5.8 (H) 03/23/2020   Lab Results  Component Value Date   INSULIN 26.2 (H) 03/23/2020   2. Vitamin D deficiency [name]'s Vitamin D level was 21.6 on 03/23/2020. She is currently taking OTC vitamin D 2000 each day. She denies nausea, vomiting or muscle weakness.  3. Other depression, with emotional eating [name] is struggling with emotional eating and using food for comfort to the extent that it is negatively impacting her health. She has been working on behavior modification techniques to help reduce her emotional eating and has been successful.   4. At risk for nausea [name] is at risk for nausea due to medication.  Assessment/Plan:   1. Prediabetes Denissa will continue to work on weight loss, exercise, and decreasing simple carbohydrates to help decrease the risk of diabetes.   2. Vitamin D deficiency Low Vitamin D level contributes to fatigue and are associated with obesity, breast, and colon cancer.  She agrees to start to take prescription Vitamin D @50 ,000 IU every week and will follow-up for routine testing of Vitamin D, at least 2-3 times per year to avoid over-replacement.  Orders - Vitamin D, Ergocalciferol, (DRISDOL) 1.25 MG (50000 UNIT) CAPS capsule; Take 1 capsule (50,000 Units total) by mouth every 7 (seven) days.  Dispense: 4 capsule; Refill: 0  3. Other depression, with emotional eating Behavior modification techniques were discussed today to help [name] deal with her emotional/non-hunger eating behaviors.  Orders and follow up as documented in patient record.   4. At risk for nausea [name] was given approximately 15 minutes of nausea prevention counseling today. [name] is at risk for nausea due to her new or current medication. She was encouraged to titrate her medication slowly, make sure to stay hydrated, eat smaller portions throughout the day, and avoid high fat meals.   5. Class 2 severe obesity with serious comorbidity and body mass index (BMI) of 38.0 to 38.9 in adult, unspecified obesity type (HCC)  Orders - Liraglutide -Weight Management (SAXENDA) 18 MG/3ML SOPN; Inject 0.5 mLs (3 mg total) into the skin daily.  Dispense: 5 pen; Refill: 0 - Insulin Pen Needle (BD PEN NEEDLE NANO 2ND GEN) 32G X 4 MM MISC; 1 Package by Does not apply route daily.  Dispense: 100 each; Refill: 0  [name] is currently in the action stage of change. As such, her goal is to continue with weight loss efforts. She has agreed to the Category 2 Plan.  Exercise goals: As is.  Behavioral modification strategies: increasing lean protein intake and increasing water intake.  [name] has agreed to follow-up with our clinic in 2 weeks. She was informed of the importance of frequent follow-up visits to maximize her success with intensive lifestyle modifications for her multiple health conditions.   Objective:   Blood pressure 136/82, pulse (!) 108, temperature 98.2 F (36.8 C),  temperature source Oral, height 5\' 4"  (1.626 m), weight 224 lb (101.6 kg), last menstrual period 03/18/2020, SpO2 99 %, unknown if currently breastfeeding. Body mass index is 38.45 kg/m.  General: Cooperative, alert, well developed, in no acute distress. HEENT: Conjunctivae and lids unremarkable. Cardiovascular: Regular rhythm.  Lungs: Normal work of breathing. Neurologic: No focal deficits.   Lab Results  Component Value Date   CREATININE 0.77 03/23/2020   BUN 14 03/23/2020   NA 136 03/23/2020   K 4.7 03/23/2020   CL 101 03/23/2020   CO2 20 03/23/2020   Lab Results  Component Value Date   ALT 18 03/23/2020   AST 21 03/23/2020   ALKPHOS 77 03/23/2020   BILITOT 0.4 03/23/2020   Lab Results  Component Value Date   HGBA1C 5.8 (H) 03/23/2020   HGBA1C 5.7 07/11/2016   HGBA1C 5.8 11/18/2013   Lab Results  Component Value Date   INSULIN 26.2 (H) 03/23/2020   Lab Results  Component Value Date   TSH 1.650 03/23/2020   Lab Results  Component Value Date   CHOL 194 03/23/2020   HDL 52 03/23/2020   LDLCALC 107 (H) 03/23/2020   TRIG 204 (H) 03/23/2020   CHOLHDL 3.7 03/23/2020   Lab Results  Component Value Date   WBC 9.0 03/23/2020   HGB 14.6 03/23/2020   HCT 44.9 03/23/2020   MCV 84 03/23/2020   PLT 411 03/23/2020   Lab Results  Component Value Date   IRON 51 03/23/2020   TIBC 344 03/23/2020   FERRITIN 51 03/23/2020   Attestation Statements:   Reviewed by clinician on day of visit: allergies, medications, problem list, medical history, surgical history, family history, social history, and previous encounter notes.  I, Water quality scientist, CMA, am acting as Location manager for [employer], DO.  I have reviewed the above documentation for accuracy and completeness, and I agree with the above. Briscoe Deutscher, DO

## 2020-04-08 ENCOUNTER — Telehealth: Payer: BC Managed Care – PPO | Admitting: Emergency Medicine

## 2020-04-08 DIAGNOSIS — N898 Other specified noninflammatory disorders of vagina: Secondary | ICD-10-CM

## 2020-04-08 MED ORDER — FLUCONAZOLE 150 MG PO TABS: 150.0000 mg | ORAL_TABLET | Freq: Once | ORAL | 0 refills | Status: AC

## 2020-04-08 MED ORDER — METRONIDAZOLE 500 MG PO TABS: 500.0000 mg | ORAL_TABLET | Freq: Two times a day (BID) | ORAL | 0 refills | Status: AC

## 2020-04-08 NOTE — Progress Notes (Signed)
We are sorry that you are not feeling well. Here is how we plan to help! Based on what you shared with me it looks like you: May have a vaginosis due to bacteria  Vaginosis is an inflammation of the vagina that can result in discharge, itching and pain. The cause is usually a change in the normal balance of vaginal bacteria or an infection. Vaginosis can also result from reduced estrogen levels after menopause.  The most common causes of vaginosis are:   Bacterial vaginosis which results from an overgrowth of one on several organisms that are normally present in your vagina.   Yeast infections which are caused by a naturally occurring fungus called candida.   Vaginal atrophy (atrophic vaginosis) which results from the thinning of the vagina from reduced estrogen levels after menopause.   Trichomoniasis which is caused by a parasite and is commonly transmitted by sexual intercourse.  Factors that increase your risk of developing vaginosis include: Marland Kitchen Medications, such as antibiotics and steroids . Uncontrolled diabetes . Use of hygiene products such as bubble bath, vaginal spray or vaginal deodorant . Douching . Wearing damp or tight-fitting clothing . Using an intrauterine device (IUD) for birth control . Hormonal changes, such as those associated with pregnancy, birth control pills or menopause . Sexual activity . Having a sexually transmitted infection  Your treatment plan is Metronidazole or Flagyl 500mg  twice a day for 7 days.  I have electronically sent this prescription into the pharmacy that you have chosen.  I have also sent a single tablet of diflucan to treat yeast infections that can occur after antibiotics.  Be sure to take all of the medication as directed. Stop taking any medication if you develop a rash, tongue swelling or shortness of breath. Mothers who are breast feeding should consider pumping and discarding their breast milk while on these antibiotics. However, there is  no consensus that infant exposure at these doses would be harmful.  Remember that medication creams can weaken latex condoms. Marland Kitchen   HOME CARE:  Good hygiene may prevent some types of vaginosis from recurring and may relieve some symptoms:  . Avoid baths, hot tubs and whirlpool spas. Rinse soap from your outer genital area after a shower, and dry the area well to prevent irritation. Don't use scented or harsh soaps, such as those with deodorant or antibacterial action. Marland Kitchen Avoid irritants. These include scented tampons and pads. . Wipe from front to back after using the toilet. Doing so avoids spreading fecal bacteria to your vagina.  Other things that may help prevent vaginosis include:  Marland Kitchen Don't douche. Your vagina doesn't require cleansing other than normal bathing. Repetitive douching disrupts the normal organisms that reside in the vagina and can actually increase your risk of vaginal infection. Douching won't clear up a vaginal infection. . Use a latex condom. Both female and female latex condoms may help you avoid infections spread by sexual contact. . Wear cotton underwear. Also wear pantyhose with a cotton crotch. If you feel comfortable without it, skip wearing underwear to bed. Yeast thrives in [employer] Your symptoms should improve in the next day or two.  GET HELP RIGHT AWAY IF:  . You have pain in your lower abdomen ( pelvic area or over your ovaries) . You develop nausea or vomiting . You develop a fever . Your discharge changes or worsens . You have persistent pain with intercourse . You develop shortness of breath, a rapid pulse, or you faint.  These  symptoms could be signs of problems or infections that need to be evaluated by a medical provider now.  MAKE SURE YOU    Understand these instructions.  Will watch your condition.  Will get help right away if you are not doing well or get worse.  Your e-visit answers were reviewed by a board certified advanced  clinical practitioner to complete your personal care plan. Depending upon the condition, your plan could have included both over the counter or prescription medications. Please review your pharmacy choice to make sure that you have choses a pharmacy that is open for you to pick up any needed prescription, Your safety is important to [place]. If you have drug allergies check your prescription carefully.   You can use MyChart to ask questions about today's visit, request a non-urgent call back, or ask for a work or school excuse for 24 hours related to this e-Visit. If it has been greater than 24 hours you will need to follow up with your provider, or enter a new e-Visit to address those concerns. You will get a MyChart message within the next two days asking about your experience. I hope that your e-visit has been valuable and will speed your recovery.  Approximately 5 minutes was used in reviewing the patient's chart, questionnaire, prescribing medications, and documentation.

## 2020-04-13 ENCOUNTER — Telehealth (INDEPENDENT_AMBULATORY_CARE_PROVIDER_SITE_OTHER): Payer: BC Managed Care – PPO | Admitting: Psychology

## 2020-04-13 ENCOUNTER — Telehealth (INDEPENDENT_AMBULATORY_CARE_PROVIDER_SITE_OTHER): Payer: Self-pay | Admitting: Psychology

## 2020-04-13 ENCOUNTER — Other Ambulatory Visit: Payer: Self-pay

## 2020-04-13 ENCOUNTER — Encounter (INDEPENDENT_AMBULATORY_CARE_PROVIDER_SITE_OTHER): Payer: Self-pay

## 2020-04-13 NOTE — Telephone Encounter (Signed)
  Office: [phone]  /  Fax: [phone]  Date of Call: April 13, 2020  Time of Call: 2:33pm Provider: Lawerance Cruel, PsyD  CONTENT: This provider called Allena Napoleon to check-in as she did not present for today's MyChart Video Visit appointment at 2:30pm. A HIPAA compliant voicemail was left requesting a call back. Of note, this provider stayed on the MyChart Video Visit appointment for 5 minutes prior to signing off per the clinic's grace period policy.    PLAN: This provider will wait for Torrie to call back. No further follow-up planned by this provider.

## 2020-04-27 ENCOUNTER — Other Ambulatory Visit (INDEPENDENT_AMBULATORY_CARE_PROVIDER_SITE_OTHER): Payer: Self-pay | Admitting: Family Medicine

## 2020-04-27 DIAGNOSIS — E559 Vitamin D deficiency, unspecified: Secondary | ICD-10-CM

## 2020-04-28 ENCOUNTER — Encounter (INDEPENDENT_AMBULATORY_CARE_PROVIDER_SITE_OTHER): Payer: Self-pay | Admitting: *Deleted

## 2020-04-28 ENCOUNTER — Ambulatory Visit (INDEPENDENT_AMBULATORY_CARE_PROVIDER_SITE_OTHER): Payer: BC Managed Care – PPO | Admitting: Family Medicine

## 2020-05-28 ENCOUNTER — Other Ambulatory Visit (INDEPENDENT_AMBULATORY_CARE_PROVIDER_SITE_OTHER): Payer: Self-pay | Admitting: Family Medicine

## 2020-05-28 DIAGNOSIS — E559 Vitamin D deficiency, unspecified: Secondary | ICD-10-CM

## 2020-05-28 DIAGNOSIS — Z6838 Body mass index (BMI) 38.0-38.9, adult: Secondary | ICD-10-CM

## 2020-06-04 ENCOUNTER — Encounter (INDEPENDENT_AMBULATORY_CARE_PROVIDER_SITE_OTHER): Payer: Self-pay | Admitting: Family Medicine

## 2020-06-07 ENCOUNTER — Ambulatory Visit (INDEPENDENT_AMBULATORY_CARE_PROVIDER_SITE_OTHER): Payer: BC Managed Care – PPO | Admitting: Family Medicine

## 2020-06-13 ENCOUNTER — Telehealth: Payer: BC Managed Care – PPO | Admitting: Physician Assistant

## 2020-06-13 DIAGNOSIS — K644 Residual hemorrhoidal skin tags: Secondary | ICD-10-CM | POA: Diagnosis not present

## 2020-06-13 MED ORDER — HYDROCORTISONE ACETATE 25 MG RE SUPP: 25.0000 mg | Freq: Two times a day (BID) | RECTAL | 0 refills | Status: AC

## 2020-06-13 MED ORDER — HYDROCORTISONE (PERIANAL) 2.5 % EX CREA: TOPICAL_CREAM | Freq: Two times a day (BID) | CUTANEOUS | Status: AC

## 2020-06-13 NOTE — Progress Notes (Signed)
E-Visit for Hemorrhoid  We are sorry that you are not feeling well. We are here to help!  Hemorrhoids are swollen veins in the rectum. They can cause itching, bleeding, and pain. Hemorrhoids are very common.  In some cases, you can see or feel hemorrhoids around the outside of the rectum. In other cases, you cannot see them because they are hidden inside the rectum. Be patient - It can take months for this to improve or go away.   Hemorrhoids do not always cause symptoms. But when they do, symptoms can include: ?Itching of the skin around the anus ?Bleeding - Bleeding is usually painless. You might see bright red blood after using the toilet. ?Pain - If a blood clot forms inside a hemorrhoid, this can cause pain. It can also cause a lump that you might be able to feel.   What can I do to keep from getting more hemorrhoids? -- The most important thing you can do is to keep from getting constipated. You should have a bowel movement at least a few times a week. When you have a bowel movement, you also should not have to push too much. Plus, your bowel movements should not be too hard. Being constipated and having hard bowel movements can make hemorrhoids worse.   I have prescribed Topical Hydrocortisone ointment 2.5%.  Apply to area two times per day for 30 days and I have prescribed Anusol HC suppositories.  Insert into rectum twice per day for 6 days  HOME CARE: . Sitz Baths twice daily. Soak buttocks in 2 or 3 inches of warm water for 10 to 15 minutes. Do not add soap, bubble bath, or anything to the water. . Stool softener such as Colace 100 mg twice daily AND Miralax 1 scoop daily until you have regular soft stools . Over the counter Preparation H . Tucks Pads . Witch Hazel  Here are some steps you can take to avoid getting constipated or having hard stools:  ?Eat lots of fruits, vegetables, and other foods with fiber. Fiber helps to increase bowel movements. If you do not get enough  fiber from your diet, you can take fiber supplements. These come in the form of powders, wafers, or pills. Some examples are Metamucil, Citrucel, Benefiber and FiberCon. If you take a fiber supplement, be sure to read the label so you know how much to take. If you're not sure, ask your provider or nurse. ?Take medicines called "stool softeners" such as docusate sodium (sample brand names: Colace, Dulcolax). These medicines increase the number of bowel movements you have. They are safe to take and they can prevent problems later.  You should request a referral for a follow up evaluation with a Gastroenterologist (GI doctor) to evaluate this chronic and relapsing condition - even if it improves to see what further steps need to be taken. This is highly linked to chronic constipation and straining to have a bowel movement. It may require further treatment or surgical intervention.   GET HELP RIGHT AWAY IF: . You develop severe pain . You have heavy bleeding   FOLLOW UP WITH YOUR PRIMARY PROVIDER IF: . If your symptoms do not improve within 10 days  MAKE SURE YOU   Understand these instructions.  Will watch your condition.  Will get help right away if you are not doing well or get worse.  Your e-visit answers were reviewed by a board certified advanced clinical practitioner to complete your personal care plan. Depending upon the  condition, your plan could have included both over the counter or prescription medications.  Your safety is important to [place]. If you have drug allergies check your prescription carefully.   You can use MyChart to ask questions about today's visit, request a non-urgent call back, or ask for a work or school excuse for 24 hours related to this e-Visit. If it has been greater than 24 hours you will need to follow up with your provider, or enter a new e-Visit to address those concerns.  You will get an e-mail with a link to a survey asking about your experience.  We hope that  your e-visit has been valuable and will speed your recovery! Thank you for using e-visits.    Greater than 5 minutes, yet less than 10 minutes of time have been spent researching, coordinating, and implementing care for this patient today

## 2020-06-14 ENCOUNTER — Telehealth: Payer: No Typology Code available for payment source

## 2020-07-06 ENCOUNTER — Other Ambulatory Visit (INDEPENDENT_AMBULATORY_CARE_PROVIDER_SITE_OTHER): Payer: Self-pay | Admitting: Family Medicine

## 2020-07-06 DIAGNOSIS — Z6838 Body mass index (BMI) 38.0-38.9, adult: Secondary | ICD-10-CM

## 2020-07-16 ENCOUNTER — Telehealth: Payer: BC Managed Care – PPO | Admitting: Emergency Medicine

## 2020-07-16 DIAGNOSIS — R21 Rash and other nonspecific skin eruption: Secondary | ICD-10-CM

## 2020-07-16 MED ORDER — MUPIROCIN 2 % EX OINT: 1.0000 "application " | TOPICAL_OINTMENT | Freq: Two times a day (BID) | CUTANEOUS | 0 refills | Status: AC

## 2020-07-16 MED ORDER — CEPHALEXIN 500 MG PO CAPS: 500.0000 mg | ORAL_CAPSULE | Freq: Three times a day (TID) | ORAL | 0 refills | Status: AC

## 2020-07-16 NOTE — Progress Notes (Signed)
E Visit for Rash  We are sorry that you are not feeling well. Here is how we plan to help!   Based upon what you have shared with me it looks like you have a bacterial follicultits.  Folliculitis is inflammation of the hair follicles that can be caused by a superficial infection of the skin and is treated with an antibiotic. I have prescribed:, Topical mupiricin and Keflex 500 mg three times per day for 7 days    HOME CARE:   Take cool showers and avoid direct sunlight.  Apply cool compress or wet dressings.  Take a bath in an oatmeal bath.  Sprinkle content of one Aveeno packet under running faucet with comfortably warm water.  Bathe for 15-20 minutes, 1-2 times daily.  Pat dry with a towel. Do not rub the rash.  Use hydrocortisone cream.  Take an antihistamine like Benadryl for widespread rashes that itch.  The adult dose of Benadryl is 25-50 mg by mouth 4 times daily.  Caution:  This type of medication may cause sleepiness.  Do not drink alcohol, drive, or operate dangerous machinery while taking antihistamines.  Do not take these medications if you have prostate enlargement.  Read package instructions thoroughly on all medications that you take.  GET HELP RIGHT AWAY IF:   Symptoms don't go away after treatment.  Severe itching that persists.  If you rash spreads or swells.  If you rash begins to smell.  If it blisters and opens or develops a yellow-brown crust.  You develop a fever.  You have a sore throat.  You become short of breath.  MAKE SURE YOU:  Understand these instructions. Will watch your condition. Will get help right away if you are not doing well or get worse.  Thank you for choosing an e-visit. Your e-visit answers were reviewed by a board certified advanced clinical practitioner to complete your personal care plan. Depending upon the condition, your plan could have included both over the counter or prescription medications. Please review your  pharmacy choice. Be sure that the pharmacy you have chosen is open so that you can pick up your prescription now.  If there is a problem you may message your provider in MyChart to have the prescription routed to another pharmacy. Your safety is important to [place]. If you have drug allergies check your prescription carefully.  For the next 24 hours, you can use MyChart to ask questions about today's visit, request a non-urgent call back, or ask for a work or school excuse from your e-visit provider. You will get an email in the next two days asking about your experience. I hope that your e-visit has been valuable and will speed your recovery.   Of note in review of medical record, patient with allergy to PCN but has tolerated cephalosporins in the past.   I spent 5-10 minutes reviewing patient's medical chart.

## 2020-08-17 ENCOUNTER — Telehealth: Payer: Medicaid Other | Admitting: Physician Assistant

## 2020-08-17 DIAGNOSIS — B9789 Other viral agents as the cause of diseases classified elsewhere: Secondary | ICD-10-CM | POA: Diagnosis not present

## 2020-08-17 DIAGNOSIS — J019 Acute sinusitis, unspecified: Secondary | ICD-10-CM

## 2020-08-17 MED ORDER — FLUTICASONE PROPIONATE 50 MCG/ACT NA SUSP: 2.0000 | Freq: Every day | NASAL | 6 refills | Status: AC

## 2020-08-17 NOTE — Progress Notes (Signed)
We are sorry that you are not feeling well.  Here is how we plan to help!  Based on what you have shared with me it looks like you have sinusitis.  Sinusitis is inflammation and infection in the sinus cavities of the head.  Based on your presentation I believe you most likely have Acute Viral Sinusitis.This is an infection most likely caused by a virus. There is not specific treatment for viral sinusitis other than to help you with the symptoms until the infection runs its course.  You may use an oral decongestant such as Mucinex D or if you have glaucoma or high blood pressure use plain Mucinex. Saline nasal spray help and can safely be used as often as needed for congestion, I have prescribed: Fluticasone nasal spray two sprays in each nostril once a day  Some authorities believe that zinc sprays or the use of Echinacea may shorten the course of your symptoms.  Sinus infections are not as easily transmitted as other respiratory infection, however we still recommend that you avoid close contact with loved ones, especially the very young and elderly.  Remember to wash your hands thoroughly throughout the day as this is the number one way to prevent the spread of infection!  Home Care:  Only take medications as instructed by your medical team.  Do not take these medications with alcohol.  A steam or ultrasonic humidifier can help congestion.  You can place a towel over your head and breathe in the steam from hot water coming from a faucet.  Avoid close contacts especially the very young and the elderly.  Cover your mouth when you cough or sneeze.  Always remember to wash your hands.  Get Help Right Away If:  You develop worsening fever or sinus pain.  You develop a severe head ache or visual changes.  Your symptoms persist after you have completed your treatment plan.  Make sure you  Understand these instructions.  Will watch your condition.  Will get help right away if you are not  doing well or get worse.  Your e-visit answers were reviewed by a board certified advanced clinical practitioner to complete your personal care plan.  Depending on the condition, your plan could have included both over the counter or prescription medications.  If there is a problem please reply  once you have received a response from your provider.  Your safety is important to [place].  If you have drug allergies check your prescription carefully.    You can use MyChart to ask questions about today's visit, request a non-urgent call back, or ask for a work or school excuse for 24 hours related to this e-Visit. If it has been greater than 24 hours you will need to follow up with your provider, or enter a new e-Visit to address those concerns.  You will get an e-mail in the next two days asking about your experience.  I hope that your e-visit has been valuable and will speed your recovery. Thank you for using e-visits.   5 minutes spent on chart

## 2020-11-01 ENCOUNTER — Telehealth: Payer: Self-pay

## 2020-11-01 NOTE — Telephone Encounter (Signed)
Transition Care Management Unsuccessful Follow-up Telephone Call  Date of discharge and from where:  10/30/2020 [facility]  Attempts:  1st Attempt  Reason for unsuccessful TCM follow-up call:  Left voice message

## 2020-11-02 NOTE — Telephone Encounter (Signed)
Transition Care Management Unsuccessful Follow-up Telephone Call  Date of discharge and from where:   10/30/2020 [facility]  Attempts:  2nd Attempt  Reason for unsuccessful TCM follow-up call:  Left voice message

## 2020-11-03 ENCOUNTER — Telehealth: Payer: Medicaid Other | Admitting: Family

## 2020-11-03 DIAGNOSIS — M545 Low back pain, unspecified: Secondary | ICD-10-CM

## 2020-11-03 MED ORDER — NAPROXEN 500 MG PO TABS: 500.0000 mg | ORAL_TABLET | Freq: Two times a day (BID) | ORAL | 0 refills | Status: AC

## 2020-11-03 MED ORDER — BACLOFEN 10 MG PO TABS: 10.0000 mg | ORAL_TABLET | Freq: Three times a day (TID) | ORAL | 0 refills | Status: AC

## 2020-11-03 NOTE — Telephone Encounter (Signed)
Transition Care Management Unsuccessful Follow-up Telephone Call  Date of discharge and from where:  11/6/2021[facility]  Attempts:  3rd Attempt  Reason for unsuccessful TCM follow-up call:  Left voice message

## 2020-11-03 NOTE — Progress Notes (Signed)

## 2022-08-02 ENCOUNTER — Encounter (INDEPENDENT_AMBULATORY_CARE_PROVIDER_SITE_OTHER): Payer: Self-pay

## 2022-09-13 DIAGNOSIS — R350 Frequency of micturition: Secondary | ICD-10-CM | POA: Diagnosis not present

## 2022-09-13 DIAGNOSIS — R3915 Urgency of urination: Secondary | ICD-10-CM | POA: Diagnosis not present

## 2022-09-20 DIAGNOSIS — O26613 Liver and biliary tract disorders in pregnancy, third trimester: Secondary | ICD-10-CM | POA: Diagnosis not present

## 2022-09-20 DIAGNOSIS — O24410 Gestational diabetes mellitus in pregnancy, diet controlled: Secondary | ICD-10-CM | POA: Diagnosis not present

## 2022-09-20 DIAGNOSIS — Z3A35 35 weeks gestation of pregnancy: Secondary | ICD-10-CM | POA: Diagnosis not present

## 2022-09-20 DIAGNOSIS — K8020 Calculus of gallbladder without cholecystitis without obstruction: Secondary | ICD-10-CM | POA: Diagnosis not present

## 2022-09-26 DIAGNOSIS — Z3A36 36 weeks gestation of pregnancy: Secondary | ICD-10-CM | POA: Diagnosis not present

## 2022-09-26 DIAGNOSIS — O99213 Obesity complicating pregnancy, third trimester: Secondary | ICD-10-CM | POA: Diagnosis not present

## 2022-09-26 DIAGNOSIS — E669 Obesity, unspecified: Secondary | ICD-10-CM | POA: Diagnosis not present

## 2022-09-26 DIAGNOSIS — O26613 Liver and biliary tract disorders in pregnancy, third trimester: Secondary | ICD-10-CM | POA: Diagnosis not present

## 2022-09-26 DIAGNOSIS — K831 Obstruction of bile duct: Secondary | ICD-10-CM | POA: Diagnosis not present

## 2022-09-26 DIAGNOSIS — O24410 Gestational diabetes mellitus in pregnancy, diet controlled: Secondary | ICD-10-CM | POA: Diagnosis not present

## 2022-10-07 DIAGNOSIS — O163 Unspecified maternal hypertension, third trimester: Secondary | ICD-10-CM | POA: Diagnosis not present

## 2022-10-07 DIAGNOSIS — Z3A37 37 weeks gestation of pregnancy: Secondary | ICD-10-CM | POA: Diagnosis not present

## 2022-10-07 DIAGNOSIS — O24410 Gestational diabetes mellitus in pregnancy, diet controlled: Secondary | ICD-10-CM | POA: Diagnosis not present
